# Patient Record
Sex: Female | Born: 1945 | Race: Black or African American | Hispanic: No | Smoking: Never smoker
Health system: Southern US, Community
[De-identification: ages and names within clinical notes are randomized; demographics above are authoritative.]

## PROBLEM LIST (undated history)

## (undated) DIAGNOSIS — I10 Essential (primary) hypertension: Secondary | ICD-10-CM

## (undated) DIAGNOSIS — D219 Benign neoplasm of connective and other soft tissue, unspecified: Secondary | ICD-10-CM

## (undated) DIAGNOSIS — M519 Unspecified thoracic, thoracolumbar and lumbosacral intervertebral disc disorder: Secondary | ICD-10-CM

## (undated) DIAGNOSIS — E7800 Pure hypercholesterolemia, unspecified: Secondary | ICD-10-CM

## (undated) DIAGNOSIS — I471 Supraventricular tachycardia: Secondary | ICD-10-CM

## (undated) DIAGNOSIS — E0590 Thyrotoxicosis, unspecified without thyrotoxic crisis or storm: Secondary | ICD-10-CM

## (undated) DIAGNOSIS — F419 Anxiety disorder, unspecified: Secondary | ICD-10-CM

## (undated) DIAGNOSIS — M8580 Other specified disorders of bone density and structure, unspecified site: Secondary | ICD-10-CM

## (undated) DIAGNOSIS — K922 Gastrointestinal hemorrhage, unspecified: Secondary | ICD-10-CM

## (undated) DIAGNOSIS — E559 Vitamin D deficiency, unspecified: Secondary | ICD-10-CM

## (undated) HISTORY — DX: Thyrotoxicosis, unspecified without thyrotoxic crisis or storm: E05.90

## (undated) HISTORY — DX: Anxiety disorder, unspecified: F41.9

## (undated) HISTORY — DX: Gastrointestinal hemorrhage, unspecified: K92.2

## (undated) HISTORY — DX: Essential (primary) hypertension: I10

## (undated) HISTORY — DX: Pure hypercholesterolemia, unspecified: E78.00

## (undated) HISTORY — DX: Supraventricular tachycardia: I47.1

## (undated) HISTORY — DX: Unspecified thoracic, thoracolumbar and lumbosacral intervertebral disc disorder: M51.9

## (undated) HISTORY — DX: Benign neoplasm of connective and other soft tissue, unspecified: D21.9

## (undated) HISTORY — DX: Other specified disorders of bone density and structure, unspecified site: M85.80

## (undated) HISTORY — DX: Vitamin D deficiency, unspecified: E55.9

## (undated) HISTORY — PX: MYOMECTOMY: SHX85

---

## 1998-02-10 ENCOUNTER — Ambulatory Visit (HOSPITAL_COMMUNITY): Admission: RE | Admit: 1998-02-10 | Discharge: 1998-02-10 | Payer: Self-pay | Admitting: Specialist

## 1998-12-18 ENCOUNTER — Emergency Department (HOSPITAL_COMMUNITY): Admission: EM | Admit: 1998-12-18 | Discharge: 1998-12-18 | Payer: Self-pay | Admitting: Emergency Medicine

## 1999-02-25 ENCOUNTER — Emergency Department (HOSPITAL_COMMUNITY): Admission: EM | Admit: 1999-02-25 | Discharge: 1999-02-25 | Payer: Self-pay

## 1999-05-21 ENCOUNTER — Emergency Department (HOSPITAL_COMMUNITY): Admission: EM | Admit: 1999-05-21 | Discharge: 1999-05-21 | Payer: Self-pay | Admitting: Emergency Medicine

## 1999-10-16 ENCOUNTER — Encounter: Payer: Self-pay | Admitting: Anesthesiology

## 1999-10-16 ENCOUNTER — Ambulatory Visit (HOSPITAL_COMMUNITY): Admission: RE | Admit: 1999-10-16 | Discharge: 1999-10-16 | Payer: Self-pay | Admitting: Anesthesiology

## 2000-05-05 ENCOUNTER — Emergency Department (HOSPITAL_COMMUNITY): Admission: EM | Admit: 2000-05-05 | Discharge: 2000-05-05 | Payer: Self-pay | Admitting: Emergency Medicine

## 2000-09-08 ENCOUNTER — Emergency Department (HOSPITAL_COMMUNITY): Admission: EM | Admit: 2000-09-08 | Discharge: 2000-09-08 | Payer: Self-pay | Admitting: Internal Medicine

## 2001-01-04 ENCOUNTER — Ambulatory Visit (HOSPITAL_COMMUNITY): Admission: RE | Admit: 2001-01-04 | Discharge: 2001-01-04 | Payer: Self-pay | Admitting: Neurosurgery

## 2001-01-04 ENCOUNTER — Encounter: Payer: Self-pay | Admitting: Neurosurgery

## 2003-05-25 ENCOUNTER — Emergency Department (HOSPITAL_COMMUNITY): Admission: EM | Admit: 2003-05-25 | Discharge: 2003-05-25 | Payer: Self-pay | Admitting: Emergency Medicine

## 2003-06-04 ENCOUNTER — Emergency Department (HOSPITAL_COMMUNITY): Admission: EM | Admit: 2003-06-04 | Discharge: 2003-06-04 | Payer: Self-pay | Admitting: Emergency Medicine

## 2003-06-06 ENCOUNTER — Ambulatory Visit (HOSPITAL_COMMUNITY): Admission: RE | Admit: 2003-06-06 | Discharge: 2003-06-06 | Payer: Self-pay | Admitting: Family Medicine

## 2003-07-04 ENCOUNTER — Ambulatory Visit (HOSPITAL_COMMUNITY): Admission: RE | Admit: 2003-07-04 | Discharge: 2003-07-04 | Payer: Self-pay | Admitting: Family Medicine

## 2003-07-05 ENCOUNTER — Ambulatory Visit: Admission: RE | Admit: 2003-07-05 | Discharge: 2003-07-05 | Payer: Self-pay | Admitting: Family Medicine

## 2004-02-22 ENCOUNTER — Ambulatory Visit (HOSPITAL_COMMUNITY): Admission: RE | Admit: 2004-02-22 | Discharge: 2004-02-22 | Payer: Self-pay | Admitting: Family Medicine

## 2005-09-25 ENCOUNTER — Encounter: Payer: Self-pay | Admitting: Specialist

## 2007-04-27 ENCOUNTER — Ambulatory Visit (HOSPITAL_COMMUNITY): Admission: RE | Admit: 2007-04-27 | Discharge: 2007-04-27 | Payer: Self-pay | Admitting: Family Medicine

## 2007-05-07 ENCOUNTER — Other Ambulatory Visit: Admission: RE | Admit: 2007-05-07 | Discharge: 2007-05-07 | Payer: Self-pay | Admitting: Interventional Radiology

## 2007-05-07 ENCOUNTER — Encounter (INDEPENDENT_AMBULATORY_CARE_PROVIDER_SITE_OTHER): Payer: Self-pay | Admitting: Interventional Radiology

## 2007-05-07 ENCOUNTER — Encounter: Admission: RE | Admit: 2007-05-07 | Discharge: 2007-05-07 | Payer: Self-pay | Admitting: Family Medicine

## 2008-06-01 ENCOUNTER — Inpatient Hospital Stay (HOSPITAL_COMMUNITY): Admission: EM | Admit: 2008-06-01 | Discharge: 2008-06-04 | Payer: Self-pay | Admitting: Emergency Medicine

## 2008-06-02 ENCOUNTER — Encounter (INDEPENDENT_AMBULATORY_CARE_PROVIDER_SITE_OTHER): Payer: Self-pay | Admitting: Gastroenterology

## 2009-09-24 ENCOUNTER — Emergency Department (HOSPITAL_COMMUNITY): Admission: EM | Admit: 2009-09-24 | Discharge: 2009-09-24 | Payer: Self-pay | Admitting: Emergency Medicine

## 2010-04-02 ENCOUNTER — Other Ambulatory Visit: Admission: RE | Admit: 2010-04-02 | Discharge: 2010-04-02 | Payer: Self-pay | Admitting: Family Medicine

## 2010-04-23 ENCOUNTER — Encounter: Admission: RE | Admit: 2010-04-23 | Discharge: 2010-04-23 | Payer: Self-pay | Admitting: Family Medicine

## 2010-06-17 ENCOUNTER — Encounter: Payer: Self-pay | Admitting: Neurosurgery

## 2010-06-17 ENCOUNTER — Encounter: Payer: Self-pay | Admitting: Family Medicine

## 2010-08-14 LAB — POCT CARDIAC MARKERS
CKMB, poc: 1.2 ng/mL (ref 1.0–8.0)
Myoglobin, poc: 69.4 ng/mL (ref 12–200)
Troponin i, poc: <0.05 ng/mL (ref 0.00–0.09)

## 2010-08-14 LAB — DIFFERENTIAL
Lymphocytes Relative: 13 % (ref 12–46)
Monocytes Absolute: 0.4 10*3/uL (ref 0.1–1.0)
Monocytes Relative: 4 % (ref 3–12)
Neutro Abs: 8.4 10*3/uL — ABNORMAL HIGH (ref 1.7–7.7)

## 2010-08-14 LAB — CBC
HCT: 39.8 % (ref 36.0–46.0)
MCHC: 33 g/dL (ref 30.0–36.0)
MCV: 88.2 fL (ref 78.0–100.0)
Platelets: 180 10*3/uL (ref 150–400)
RDW: 13.8 % (ref 11.5–15.5)

## 2010-08-14 LAB — COMPREHENSIVE METABOLIC PANEL
Albumin: 4.5 g/dL (ref 3.5–5.2)
BUN: 9 mg/dL (ref 6–23)
Calcium: 9.5 mg/dL (ref 8.4–10.5)
Creatinine, Ser: 0.62 mg/dL (ref 0.4–1.2)
Total Protein: 9 g/dL — ABNORMAL HIGH (ref 6.0–8.3)

## 2010-09-10 LAB — BASIC METABOLIC PANEL
BUN: 3 mg/dL — ABNORMAL LOW (ref 6–23)
CO2: 24 mEq/L (ref 19–32)
CO2: 26 mEq/L (ref 19–32)
Calcium: 8 mg/dL — ABNORMAL LOW (ref 8.4–10.5)
Calcium: 8.1 mg/dL — ABNORMAL LOW (ref 8.4–10.5)
Chloride: 104 mEq/L (ref 96–112)
Chloride: 108 mEq/L (ref 96–112)
Creatinine, Ser: 0.57 mg/dL (ref 0.4–1.2)
GFR calc Af Amer: >60 mL/min (ref >60)
GFR calc Af Amer: >60 mL/min (ref >60)
Glucose, Bld: 92 mg/dL (ref 70–99)
Potassium: 3.9 mEq/L (ref 3.5–5.1)
Sodium: 137 mEq/L (ref 135–145)

## 2010-09-10 LAB — COMPREHENSIVE METABOLIC PANEL
ALT: 19 U/L (ref 0–35)
AST: 20 U/L (ref 0–37)
AST: 22 U/L (ref 0–37)
Albumin: 3.4 g/dL — ABNORMAL LOW (ref 3.5–5.2)
Alkaline Phosphatase: 58 U/L (ref 39–117)
BUN: 7 mg/dL (ref 6–23)
CO2: 26 mEq/L (ref 19–32)
Calcium: 8.4 mg/dL (ref 8.4–10.5)
Chloride: 104 mEq/L (ref 96–112)
Creatinine, Ser: 0.71 mg/dL (ref 0.4–1.2)
GFR calc Af Amer: >60 mL/min (ref >60)
GFR calc Af Amer: >60 mL/min (ref >60)
Glucose, Bld: 128 mg/dL — ABNORMAL HIGH (ref 70–99)
Potassium: 3.2 mEq/L — ABNORMAL LOW (ref 3.5–5.1)
Sodium: 137 mEq/L (ref 135–145)
Total Bilirubin: 0.8 mg/dL (ref 0.3–1.2)
Total Protein: 6.5 g/dL (ref 6.0–8.3)
Total Protein: 7.2 g/dL (ref 6.0–8.3)

## 2010-09-10 LAB — CBC
HCT: 31.1 % — ABNORMAL LOW (ref 36.0–46.0)
HCT: 36 % (ref 36.0–46.0)
Hemoglobin: 10.4 g/dL — ABNORMAL LOW (ref 12.0–15.0)
Hemoglobin: 10.6 g/dL — ABNORMAL LOW (ref 12.0–15.0)
Hemoglobin: 12.7 g/dL (ref 12.0–15.0)
MCHC: 32.8 g/dL (ref 30.0–36.0)
MCHC: 33.2 g/dL (ref 30.0–36.0)
MCHC: 33.5 g/dL (ref 30.0–36.0)
MCV: 88.1 fL (ref 78.0–100.0)
MCV: 88.3 fL (ref 78.0–100.0)
Platelets: 180 10*3/uL (ref 150–400)
RBC: 3.53 MIL/uL — ABNORMAL LOW (ref 3.87–5.11)
RBC: 3.6 MIL/uL — ABNORMAL LOW (ref 3.87–5.11)
RBC: 4.43 MIL/uL (ref 3.87–5.11)
RDW: 13.3 % (ref 11.5–15.5)
RDW: 13.5 % (ref 11.5–15.5)
RDW: 13.6 % (ref 11.5–15.5)
WBC: 12.6 10*3/uL — ABNORMAL HIGH (ref 4.0–10.5)
WBC: 12.9 10*3/uL — ABNORMAL HIGH (ref 4.0–10.5)

## 2010-09-10 LAB — STOOL CULTURE

## 2010-09-10 LAB — CLOSTRIDIUM DIFFICILE EIA

## 2010-09-10 LAB — DIFFERENTIAL
Basophils Relative: 0 % (ref 0–1)
Eosinophils Absolute: 0 10*3/uL (ref 0.0–0.7)
Eosinophils Relative: 0 % (ref 0–5)
Lymphs Abs: 1.2 10*3/uL (ref 0.7–4.0)
Monocytes Relative: 5 % (ref 3–12)
Neutrophils Relative %: 85 % — ABNORMAL HIGH (ref 43–77)

## 2010-09-10 LAB — APTT: aPTT: 31 seconds (ref 24–37)

## 2010-09-10 LAB — TSH: TSH: 0.861 u[IU]/mL (ref 0.350–4.500)

## 2010-09-10 LAB — PROTIME-INR: INR: 1.1 (ref 0.00–1.49)

## 2010-09-10 LAB — FECAL LACTOFERRIN, QUANT: Fecal Lactoferrin: POSITIVE

## 2010-09-10 LAB — HEMOCCULT GUIAC POC 1CARD (OFFICE): Fecal Occult Bld: POSITIVE

## 2010-10-09 NOTE — H&P (Signed)
NAME:  Heidi Lucero, Heidi Lucero                 ACCOUNT NO.:  [id number]   MEDICAL RECORD NO.:  [id number]          PATIENT TYPE:  INP   LOCATION:  1317                         FACILITY[facility]  [facility]   PHYSICIAN:  Michiel Cowboy, MDDATE OF BIRTH:  Oct 26, 1945   DATE OF ADMISSION:  05/31/2008  DATE OF DISCHARGE:                              HISTORY & PHYSICAL   PRIMARY CARE [name]:  [name], M.D.   CHIEF COMPLAINT:  Bloody diarrhea.   The patient is a 65 year old female with a past medical history of  hypertension, hyperlipidemia.  Prior to this very healthy, had a normal  day, but then all of a sudden yesterday developed nausea, vomiting and  diarrhea with some bright red blood in the toilet bowl and on the  tissue.  She has no pain with defecation but has a lot of abdominal pain  and cramping.  She had history of hemorrhoids in the past but has not  had any recently.  Never had blood per rectum before.  Otherwise, no  fevers, no chills, no chest pain or shortness of breath.  She works as a  Interior and spatial designer.  She did not have any recent antibiotic exposure.  She did  eat a sandwich from [employer] but otherwise has not eaten out  recently.   REVIEW OF SYSTEMS:  Otherwise negative.   PAST MEDICAL HISTORY:  1. Significant for hypertension.  2. Hypothyroidism.   SOCIAL HISTORY:  The patient never smoked, does not use drugs.  Does not  drink.  Works as a Interior and spatial designer.   FAMILY HISTORY:  Noncontributory.   ALLERGIES:  No known drug allergies.   MEDICATIONS:  1. Amlodipine 10 mg daily.  2. Lisinopril/hydrochlorothiazide 20/25 mg daily.  3. Synthroid 50 mcg daily.   VITALS:  Temperature 98.9, blood pressure 153/54, pulse 130, now down to  97, respirations 15, saturation 99% on room air.  The patient appears to  be currently in no acute distress.  HEAD:  Nontraumatic.  Moist mucous membranes.  LUNGS:  Clear to auscultation bilaterally.  HEART:  Regular rate and rhythm.  No murmurs,  rubs or gallops.  ABDOMEN:  Soft, slightly obese, has generalized tenderness but not  severe.  No guarding.  No peritoneal signs noted.  LOWER EXTREMITIES:  Without clubbing, cyanosis or edema.   ED Hemoccult positive stools.   LABS:  White blood cell count 11.9, hemoglobin 12.7.  sodium 147,  potassium 3.2, creatinine 0.65.   CT scan of the abdomen and pelvis showed severe colitis of transverse  and descending colon, likely infectious in etiology.   ASSESSMENT AND PLAN:  1. Colitis:  Worrisome for being infectious so will send for stool      studies including  stool culture.  White blood cell count.  C. diff      although this is somewhat less likely.  Will cover with Flagyl and      Cipro, given severity of illness.  Will make n.p.o.  May need GI      consult if persists.  2. Hypertension:  Continue amlodipine plus lisinopril, would hold  hydrochlorothiazide.  3. Prophylaxis:  Protonix plus SCDs.  4. Bright red blood per rectum:  I think this is related to colitis,      probably invasive bacteria.  No symptoms that would be consistent      with upper source, also could be secondary to hemorrhoids.  If does      not improve, will need GI consult likely.  Would record stool color      and number.  Follow CBC.  At this point stable.  Give IV fluids.      Michiel Cowboy, MD  Electronically Signed     AVD/MEDQ  D:  06/01/2008  T:  06/01/2008  Job:  [id number]   cc:   Stacie Acres. Cliffton Asters, M.D.  Fax: [phone]

## 2010-10-09 NOTE — Discharge Summary (Signed)
NAME:  Heidi Lucero, Heidi Lucero                 ACCOUNT NO.:  [id number]   MEDICAL RECORD NO.:  [id number]          PATIENT TYPE:  INP   LOCATION:  1317                         FACILITY:  [facility]   PHYSICIAN:  Kela Millin, M.D.DATE OF BIRTH:  10-20-1945   DATE OF ADMISSION:  05/31/2008  DATE OF DISCHARGE:  06/05/2008                               DISCHARGE SUMMARY   DISCHARGE DIAGNOSES:  1. Ischemic colitis.  2. Hypokalemia.  3. Hypertension.  4. Hypothyroidism.   PROCEDURES AND STUDIES:  1. CT scan of abdomen and pelvis - severe colitis involving the      transverse and descending colon.  No evidence of abscess.  No free      intraperitoneal fluid.  Small hiatal hernia.  A calcified      degenerated uterine fibroid arising from the mid uterine body is      noted.  2. EGD on June 02, 2008, per Dr. Randa Evens - ischemic colitis of the      descending transverse colon.  Biopsies done and the pathology came      back consistent with ischemic colitis as well.   BRIEF HISTORY:  The patient is a pleasant 65 year old black female with  the above listed medical problems who presented with complaints of  bloody diarrhea.  She reported that she had been doing well but  developed sudden onset nausea, vomiting and diarrhea with bright red  blood in the toilet bowl.  She denied any recent antibiotic exposure.  She reported that she had eaten a [employer] sandwich but otherwise had  not eaten out recently.  She denied fevers, chills, chest pain,  shortness of breath.  She was admitted for further evaluation and  management.   Please see the full admission history and physical dictated on May 31, 2008, by Dr. Adela Glimpse for the details of admission, physical exam as  well as the laboratory data.   HOSPITAL COURSE:  1. Ischemic colitis - upon admission the patient was empirically      started on antibiotics and gastroenterology was consulted for      further recommendations.  [facility] saw the  patient and the      impression was that it was more likely ischemic colitis.  A      colonoscopy was done on June 02, 2008, and it showed ischemic      colitis.  The patient was placed on a clear liquid diet which has      been advanced as tolerated by GI to a low residual diet.  She is      tolerating solids well at this time and GI recommended discharge to      follow up outpatient with Dr. Ewing Schlein in 3-4 weeks.  Dr. Randa Evens      indicated that the patient will not require any further      antibiotics.  2. Hypokalemia - her potassium was replaced while in the hospital, her      last potassium prior to discharge 4.2.  3. Hypertension - she was maintained on her outpatient medications and  is to continue this upon discharge.   DISCHARGE MEDICATIONS:  1. She is to continue Synthroid 50 mcg p.o. daily.  2. Norvasc 10 mg p.o. daily.  3. Lisinopril/HCTZ 20/25 mg p.o. daily.   DISCHARGE DIET:  Low residual diet.   FOLLOWUP CARE:  1. Dr. Laurann Montana in 1-2 weeks - call for appointment.  2. Dr. Ewing Schlein in 3-4 weeks - call for appointment.   DISCHARGE CONDITION:  Improved/stable.      Kela Millin, M.D.  Electronically Signed     ACV/MEDQ  D:  06/04/2008  T:  06/04/2008  Job:  [id number]   cc:   Petra Kuba, M.D.  Fax: [phone]   Stacie Acres. Cliffton Asters, M.D.  Fax: [phone]

## 2010-10-09 NOTE — Op Note (Signed)
NAME:  Heidi Lucero, Heidi Lucero                 ACCOUNT NO.:  [id number]   MEDICAL RECORD NO.:  [id number]          PATIENT TYPE:  INP   LOCATION:  1317                         FACILITY:  [facility]   PHYSICIAN:  James L. Malon Kindle., M.D.DATE OF BIRTH:  1946/05/01   DATE OF PROCEDURE:  06/02/2008  DATE OF DISCHARGE:                               OPERATIVE REPORT   PROCEDURE:  Flexible sigmoidoscopy and biopsy.   MEDICATIONS:  Fentanyl 50 mcg and Versed 5 mg IV.   INDICATIONS:  This 65 year old woman came with diffuse abdominal pain.  CT showed diffuse segmental colitis in the transverse and descending  colon.   DESCRIPTION OF PROCEDURE:  The procedure had been explained and patient  consent obtained.  With the patient in the left lateral decubitus  position, the Pentax pediatric colonoscope was inserted and advanced.  We advanced to 80 cm.  There was diffuse colitis above that and up to 80  cm.  There was segmental diffuse edema and ulceration, and multiple  biopsies were taken.  As the scope was within at approximately 45-50 cm  in the sigmoid colon, it was at that transition point where it changed  it back to normal mucosa.  The sigmoid and rectum were essentially  normal.  The scope was withdrawn and the patient tolerated the procedure  well.   ASSESSMENT:  Ischemic colitis of the descending transverse colon.   PLAN:  Will keep the patient on full liquids and MiraLax and will  discharge her home as soon as she is clearly clinically better.           ______________________________  Llana Aliment Malon Kindle., M.D.     Waldron Session  D:  06/02/2008  T:  06/02/2008  Job:  [id number]   cc:   Stacie Acres. Cliffton Asters, M.D.  Fax: [phone]

## 2010-10-09 NOTE — Consult Note (Signed)
NAME:  Heidi Lucero, Heidi Lucero                 ACCOUNT NO.:  [id number]   MEDICAL RECORD NO.:  [id number]          PATIENT TYPE:  INP   LOCATION:  1317                         [facility]ACILITY:  [facility]   PHYSICIAN:  Petra Kuba, M.D.    DATE OF BIRTH:  [birth year]   DATE OF CONSULTATION:  06/01/2008  DATE OF DISCHARGE:                                 CONSULTATION   REFERRING PHYSICIAN:  Civil engineer, contracting.   HISTORY OF PRESENT ILLNESS:  This is a very pleasant 65year-old female  who began having severe abdominal pain and cramps yesterday.  She  reports that she had three episodes of diarrhea and then began having  bright red blood mixed with her diarrhea.  She has multiple bloody  stools overnight and only one so far today.  The patient denies fever or  other recent illness.  She tells me that she usually stools at least  once a day and frequently takes an herbal laxative to stool two to three  times a day.  She occasionally takes BC powders.  She has no history of  GI bleeding.  She has never had a colonoscopy.  She does know Dr. Ewing Schlein  as she has taken several family members to see him.   PAST MEDICAL HISTORY:  Is significant for:  1. Hypertension.  2. Hypothyroidism.  3. Hemorrhoids.   PRIMARY CARE PHYSICIAN:  Her PCP is Dr. Laurann Montana.   CURRENT MEDICATIONS:  Include amlodipine, lisinopril, HCTZ, Synthroid.   ALLERGIES:  She has no known drug allergies.   REVIEW OF SYSTEMS:  Negative for fever, weight loss or weight gain.   SOCIAL HISTORY:  Is negative for tobacco, alcohol and drugs.  She works  as a Interior and spatial designer.   FAMILY HISTORY:  Is negative for colon cancer.  Negative for known GI  bleeding other than hemorrhoids.   PHYSICAL EXAM:  She is alert and oriented in no apparent distress.  Temperature is 98.7, pulse 80, respirations 18, blood pressures 125/60.  HEART:  Has a regular rate and rhythm.  LUNGS:  Clear to auscultation anteriorly.  ABDOMEN:  Soft, nontender, not  distended with good bowel sounds.   LABORATORIES:  Show a potassium of 3.1, hemoglobin of 11.8, hematocrit  36.0, white count 12.9, platelets 180,000.  She is guaiac-positive.  Stool for C diff lactoferrin, stool culture are all pending.   RADIOLOGICAL EXAMS:  Include a CT of her abdomen, pelvis that was done  today.  She had wall thickening in her transverse as well as her  descending and sigmoid colon.  Her rectum was spared.  She has severe  pericolonic inflammation.   ASSESSMENT:  Dr. Vida Rigger has seen and examined the patient, collected  a history and reviewed her chart.  His impression is this is a 62-year-  old female with gastrointestinal bleeding that is likely ischemic  colitis.  We will plan for a flexible sigmoidoscopy with biopsy at 9:00  a.m. in the morning with Dr. Carman Ching.  Other than that we will  provide her with supportive care including fluids and blood transfusions  if they are needed.  Thanks very much.      Stephani Police, PA    ______________________________  Petra Kuba, M.D.    MLY/MEDQ  D:  06/01/2008  T:  06/01/2008  Job:  [id number]   cc:   Stacie Acres. Cliffton Asters, M.D.  Fax: [phone]   Petra Kuba, M.D.  Fax: [phone]

## 2011-03-12 ENCOUNTER — Other Ambulatory Visit: Payer: Self-pay | Admitting: Family Medicine

## 2011-03-12 DIAGNOSIS — E041 Nontoxic single thyroid nodule: Secondary | ICD-10-CM

## 2011-04-01 ENCOUNTER — Ambulatory Visit
Admission: RE | Admit: 2011-04-01 | Discharge: 2011-04-01 | Disposition: A | Discharge status: Home / Self Care | Payer: 59 | Source: Ambulatory Visit | Attending: Family Medicine | Admitting: Family Medicine

## 2011-04-01 DIAGNOSIS — E041 Nontoxic single thyroid nodule: Secondary | ICD-10-CM

## 2012-02-13 ENCOUNTER — Other Ambulatory Visit: Payer: Self-pay | Admitting: Family Medicine

## 2012-02-13 DIAGNOSIS — E041 Nontoxic single thyroid nodule: Secondary | ICD-10-CM

## 2012-03-16 ENCOUNTER — Ambulatory Visit
Admission: RE | Admit: 2012-03-16 | Discharge: 2012-03-16 | Disposition: A | Discharge status: Home / Self Care | Payer: Medicare Other | Source: Ambulatory Visit | Attending: Family Medicine | Admitting: Family Medicine

## 2012-03-16 DIAGNOSIS — E041 Nontoxic single thyroid nodule: Secondary | ICD-10-CM

## 2012-03-18 ENCOUNTER — Other Ambulatory Visit: Payer: 59

## 2012-08-17 ENCOUNTER — Other Ambulatory Visit: Payer: Self-pay

## 2012-08-17 ENCOUNTER — Emergency Department (HOSPITAL_COMMUNITY)
Admission: EM | Admit: 2012-08-17 | Discharge: 2012-08-17 | Disposition: A | Discharge status: Home / Self Care | Payer: No Typology Code available for payment source | Attending: Emergency Medicine | Admitting: Emergency Medicine

## 2012-08-17 ENCOUNTER — Encounter (HOSPITAL_COMMUNITY): Payer: Self-pay | Admitting: Emergency Medicine

## 2012-08-17 DIAGNOSIS — E079 Disorder of thyroid, unspecified: Secondary | ICD-10-CM | POA: Insufficient documentation

## 2012-08-17 DIAGNOSIS — I10 Essential (primary) hypertension: Secondary | ICD-10-CM | POA: Insufficient documentation

## 2012-08-17 DIAGNOSIS — Y9389 Activity, other specified: Secondary | ICD-10-CM | POA: Insufficient documentation

## 2012-08-17 DIAGNOSIS — R55 Syncope and collapse: Secondary | ICD-10-CM | POA: Insufficient documentation

## 2012-08-17 DIAGNOSIS — Z79899 Other long term (current) drug therapy: Secondary | ICD-10-CM | POA: Insufficient documentation

## 2012-08-17 DIAGNOSIS — Z043 Encounter for examination and observation following other accident: Secondary | ICD-10-CM | POA: Insufficient documentation

## 2012-08-17 DIAGNOSIS — Y92410 Unspecified street and highway as the place of occurrence of the external cause: Secondary | ICD-10-CM | POA: Insufficient documentation

## 2012-08-17 HISTORY — DX: Essential (primary) hypertension: I10

## 2012-08-17 LAB — POCT I-STAT, CHEM 8
Calcium, Ion: 1.18 mmol/L (ref 1.13–1.30)
Creatinine, Ser: 0.7 mg/dL (ref 0.50–1.10)
Glucose, Bld: 93 mg/dL (ref 70–99)
HCT: 37 % (ref 36.0–46.0)
Hemoglobin: 12.6 g/dL (ref 12.0–15.0)
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
## 2013-03-05 ENCOUNTER — Encounter: Payer: Self-pay | Admitting: Cardiology

## 2013-03-28 ENCOUNTER — Encounter: Payer: Self-pay | Admitting: Cardiology

## 2013-03-29 ENCOUNTER — Ambulatory Visit (INDEPENDENT_AMBULATORY_CARE_PROVIDER_SITE_OTHER): Payer: Medicare Other | Admitting: Cardiology

## 2013-03-29 ENCOUNTER — Encounter: Payer: Self-pay | Admitting: Cardiology

## 2013-03-29 VITALS — BP 124/76 | HR 83 | Ht 64.0 in | Wt 175.0 lb

## 2013-03-29 DIAGNOSIS — I10 Essential (primary) hypertension: Secondary | ICD-10-CM

## 2013-03-29 DIAGNOSIS — I471 Supraventricular tachycardia, unspecified: Secondary | ICD-10-CM | POA: Insufficient documentation

## 2013-03-29 DIAGNOSIS — R55 Syncope and collapse: Secondary | ICD-10-CM

## 2013-03-29 HISTORY — DX: Supraventricular tachycardia, unspecified: I47.10

## 2013-03-29 HISTORY — DX: Essential (primary) hypertension: I10

## 2013-03-29 HISTORY — DX: Supraventricular tachycardia: I47.1
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
## 2013-03-29 NOTE — Patient Instructions (Signed)
Your physician wants you to follow-up in: 1 YEAR WITH DR. SKAINS  You will receive a reminder letter in the mail two months in advance. If you don't receive a letter, please call our office to schedule the follow-up appointment.  Your physician recommends that you continue on your current medications as directed. Please refer to the Current Medication list given to you today.  

## 2013-04-05 ENCOUNTER — Ambulatory Visit: Payer: Medicare Other | Admitting: Cardiology

## 2013-04-17 ENCOUNTER — Emergency Department (HOSPITAL_COMMUNITY)
Admission: EM | Admit: 2013-04-17 | Discharge: 2013-04-17 | Disposition: A | Discharge status: Home / Self Care | Payer: Medicare Other | Attending: Emergency Medicine | Admitting: Emergency Medicine

## 2013-04-17 ENCOUNTER — Encounter (HOSPITAL_COMMUNITY): Payer: Self-pay | Admitting: Emergency Medicine

## 2013-04-17 DIAGNOSIS — E0590 Thyrotoxicosis, unspecified without thyrotoxic crisis or storm: Secondary | ICD-10-CM | POA: Insufficient documentation

## 2013-04-17 DIAGNOSIS — F411 Generalized anxiety disorder: Secondary | ICD-10-CM | POA: Insufficient documentation

## 2013-04-17 DIAGNOSIS — F19939 Other psychoactive substance use, unspecified with withdrawal, unspecified: Secondary | ICD-10-CM | POA: Insufficient documentation

## 2013-04-17 DIAGNOSIS — Z8679 Personal history of other diseases of the circulatory system: Secondary | ICD-10-CM | POA: Insufficient documentation

## 2013-04-17 DIAGNOSIS — Z8742 Personal history of other diseases of the female genital tract: Secondary | ICD-10-CM | POA: Insufficient documentation

## 2013-04-17 DIAGNOSIS — M899 Disorder of bone, unspecified: Secondary | ICD-10-CM | POA: Insufficient documentation

## 2013-04-17 DIAGNOSIS — M519 Unspecified thoracic, thoracolumbar and lumbosacral intervertebral disc disorder: Secondary | ICD-10-CM | POA: Insufficient documentation

## 2013-04-17 DIAGNOSIS — E7800 Pure hypercholesterolemia, unspecified: Secondary | ICD-10-CM | POA: Insufficient documentation

## 2013-04-17 DIAGNOSIS — Z79899 Other long term (current) drug therapy: Secondary | ICD-10-CM | POA: Insufficient documentation

## 2013-04-17 DIAGNOSIS — Z8719 Personal history of other diseases of the digestive system: Secondary | ICD-10-CM | POA: Insufficient documentation

## 2013-04-17 DIAGNOSIS — I10 Essential (primary) hypertension: Secondary | ICD-10-CM | POA: Insufficient documentation

## 2013-04-17 DIAGNOSIS — F13239 Sedative, hypnotic or anxiolytic dependence with withdrawal, unspecified: Secondary | ICD-10-CM

## 2013-04-17 DIAGNOSIS — E559 Vitamin D deficiency, unspecified: Secondary | ICD-10-CM | POA: Insufficient documentation

## 2013-04-17 LAB — POCT I-STAT, CHEM 8
Calcium, Ion: 1.17 mmol/L (ref 1.13–1.30)
Chloride: 101 mEq/L (ref 96–112)
Glucose, Bld: 105 mg/dL — ABNORMAL HIGH (ref 70–99)
HCT: 40 % (ref 36.0–46.0)
TCO2: 28 mmol/L (ref 0–100)

## 2013-04-17 MED ORDER — DIAZEPAM 5 MG/ML IJ SOLN
5.0000 mg | Freq: Once | INTRAMUSCULAR | Status: AC
  Administered 2013-04-17: 5 mg via INTRAMUSCULAR
  Filled 2013-04-17: qty 2

## 2013-04-17 MED ORDER — DIAZEPAM 2 MG PO TABS: 2.0000 mg | ORAL_TABLET | Freq: Every evening | ORAL | Status: AC | PRN

## 2013-04-17 NOTE — ED Notes (Signed)
The pt has been unable to control her rt arm  Intermittently since last pm.  No pain any where no facial droop no gait problem.  At present she keeps turning and twisting her entire rt arm and appears to be able to control it.  No previous history rt handed

## 2013-04-17 NOTE — ED Provider Notes (Signed)
CSN: [id number]     Arrival date & time 04/17/13  1801 History   First MD Initiated Contact with Patient 04/17/13 1919     Chief Complaint  Patient presents with  . rt arm spastic    (Consider location/radiation/quality/duration/timing/severity/associated sxs/prior Treatment) HPI Comments: Patient is a 67 yo F PMHx significant for HTN, hyperthyroidism, Anxiety presenting to the ED for right arm and leg spasms. The patient states she has been having intermittent uncontrolled movements of her right arm and leg w/o pain, numbness or weakness at rest over the last day and a half. Patient states the only change in her daily routine is that she ran out of her Valium that she is taking for anxiety. She denies any falls or traumas. Denies any recent illness. Patient denies SI or HI.    Past Medical History  Diagnosis Date  . Hypertension   . Hyperthyroidism   . Fibroids   . Anxiety   . Hypercholesteremia   . Lumbar disc disorder     , Bulging  . Vitamin D deficiency   . Osteopenia   . GI (gastrointestinal bleed)   . PSVT (paroxysmal supraventricular tachycardia) 03/29/2013    Likely PAT.  Marland Kitchen Essential hypertension 03/29/2013   Past Surgical History  Procedure Laterality Date  . Myomectomy     Family History  Problem Relation Age of Onset  . Hypertension Mother   . Heart attack Father   . Hypertension Father    History  Substance Use Topics  . Smoking status: Never Smoker   . Smokeless tobacco: Not on file  . Alcohol Use: No   OB History   Grav Para Term Preterm Abortions TAB SAB Ect Mult Living                 Review of Systems  Constitutional: Negative for fever and chills.  HENT: Negative.   Eyes: Negative.   Respiratory: Negative for shortness of breath.   Cardiovascular: Negative for chest pain.  Gastrointestinal: Negative for nausea and vomiting.  Genitourinary: Negative.   Musculoskeletal: Negative for joint swelling.  Skin: Negative.   Neurological: Negative for  headaches.  Psychiatric/Behavioral: Negative for suicidal ideas and self-injury. The patient is nervous/anxious.     Allergies  Crestor; Lipitor; and Pravastatin  Home Medications   Current Outpatient Rx  Name  Route  Sig  Dispense  Refill  . amLODipine (NORVASC) 10 MG tablet   Oral   Take 10 mg by mouth daily.         . Coenzyme Q10 (CO Q 10) 100 MG CAPS   Oral   Take 1 tablet by mouth daily.         Marland Kitchen ezetimibe (ZETIA) 10 MG tablet   Oral   Take 10 mg by mouth daily.         Marland Kitchen levothyroxine (SYNTHROID, LEVOTHROID) 50 MCG tablet   Oral   Take 50 mcg by mouth daily.         Marland Kitchen lisinopril-hydrochlorothiazide (PRINZIDE,ZESTORETIC) 20-25 MG per tablet   Oral   Take 1 tablet by mouth daily.         . metoprolol succinate (TOPROL-XL) 25 MG 24 hr tablet   Oral   Take 25 mg by mouth daily.         . Multiple Vitamin (MULTIVITAMIN WITH MINERALS) TABS   Oral   Take 1 tablet by mouth daily.         . Vitamin D, Ergocalciferol, (DRISDOL) 50000 UNITS  CAPS capsule   Oral   Take 50,000 Units by mouth every 7 (seven) days.         . diazepam (VALIUM) 2 MG tablet   Oral   Take 1 tablet (2 mg total) by mouth at bedtime as needed for anxiety.   7 tablet   0    BP 148/91  Pulse 72  Temp(Src) 97.7 F (36.5 C) (Oral)  Resp 16  SpO2 95% Physical Exam  Constitutional: She is oriented to person, place, and time. She appears well-developed and well-nourished. No distress.  HENT:  Head: Normocephalic and atraumatic.  Right Ear: External ear normal.  Left Ear: External ear normal.  Nose: Nose normal.  Mouth/Throat: Oropharynx is clear and moist. No oropharyngeal exudate.  Eyes: Conjunctivae and EOM are normal. Pupils are equal, round, and reactive to light.  Neck: Normal range of motion. Neck supple.  Cardiovascular: Normal rate, regular rhythm, normal heart sounds and intact distal pulses.   Pulmonary/Chest: Effort normal and breath sounds normal. No  respiratory distress.  Abdominal: Soft. There is no tenderness.  Musculoskeletal: Normal range of motion. She exhibits no edema.  Controlled voluntary movements.  At rest non-rhythmic right UE and LE movement.   Neurological: She is alert and oriented to person, place, and time. She has normal strength. No cranial nerve deficit or sensory deficit. Gait normal. GCS eye subscore is 4. GCS verbal subscore is 5. GCS motor subscore is 6.  No pronator drift. Bilateral heel-knee-shin intact.  Skin: Skin is warm and dry. She is not diaphoretic.  Psychiatric: Her mood appears anxious. She expresses no homicidal and no suicidal ideation.    ED Course  Procedures (including critical care time) Medications  diazepam (VALIUM) injection 5 mg (5 mg Intramuscular Given 04/17/13 2046)    Labs Review Labs Reviewed  POCT I-STAT, CHEM 8 - Abnormal; Notable for the following:    Glucose, Bld 105 (*)    All other components within normal limits   Imaging Review No results found.  EKG Interpretation   None       MDM   1. Benzodiazepine withdrawal    Afebrile, NAD, non-toxic appearing, AAOx4. Pt anxious appearing, w/o SI or HI. No neurofocal deficits. Normal sensation. Pt with non-rhythmic involuntary R UE and LE movement. Patient with controlled movements. No pain, numbness, or weakness noted. Intact distal pulses. Presentation non-concerning for intracranial process, likely related to start of benzodiazepine withdrawal as patient has been out of her Ativan for the last few days. Dosed patient in ED and will prescribe limited refill. Patient states her symptoms improved after receiving Ativan in ED. Advised to f/u with PCP regarding refill. Return precautions discussed. Patient is agreeable to plan. Patient is stable at time of discharge. Patient d/w with Dr. Gari Crown, agrees with plan.         Jeannetta Ellis, PA-C 04/18/13 0013

## 2013-04-17 NOTE — ED Provider Notes (Signed)
Medical screening examination/treatment/procedure(s) were conducted as a shared visit with non-physician practitioner(s) and myself.  I personally evaluated the patient during the encounter.  EKG Interpretation   None      Patient ran out of Valium within the last several days and over the last 2 days has intermittent nonrhythmic movements of her entire right arm and leg intermittently with no difficulty with changing to voluntary control and movements of her right arm and leg even during these episodes; she is no weakness or numbness to her right arm or leg no pain to her right arm or leg, she does appear anxious but is not a threat to harm herself or others she is no suicidal or homicidal ideation or hallucinations she is no chest pain or shortness of breath. Feel unlikely to be focal motor seizure activity.  Hurman Horn, MD 04/18/13 [id number]

## 2013-05-03 ENCOUNTER — Other Ambulatory Visit: Payer: Self-pay

## 2013-05-03 MED ORDER — METOPROLOL SUCCINATE ER 25 MG PO TB24: 25.0000 mg | ORAL_TABLET | Freq: Every day | ORAL | Status: DC

## 2013-05-05 ENCOUNTER — Other Ambulatory Visit: Payer: Self-pay

## 2013-05-05 MED ORDER — METOPROLOL SUCCINATE ER 25 MG PO TB24: 25.0000 mg | ORAL_TABLET | Freq: Every day | ORAL | Status: AC

## 2013-06-29 ENCOUNTER — Other Ambulatory Visit: Payer: Self-pay | Admitting: Family Medicine

## 2013-06-29 DIAGNOSIS — M542 Cervicalgia: Secondary | ICD-10-CM

## 2013-06-29 DIAGNOSIS — M5136 Other intervertebral disc degeneration, lumbar region: Secondary | ICD-10-CM

## 2013-06-29 DIAGNOSIS — M5412 Radiculopathy, cervical region: Secondary | ICD-10-CM

## 2013-07-05 ENCOUNTER — Ambulatory Visit
Admission: RE | Admit: 2013-07-05 | Discharge: 2013-07-05 | Disposition: A | Discharge status: Home / Self Care | Payer: Medicare Other | Source: Ambulatory Visit | Attending: Family Medicine | Admitting: Family Medicine

## 2013-07-05 DIAGNOSIS — M5136 Other intervertebral disc degeneration, lumbar region: Secondary | ICD-10-CM

## 2013-07-05 DIAGNOSIS — M542 Cervicalgia: Secondary | ICD-10-CM

## 2013-07-05 DIAGNOSIS — M51369 Other intervertebral disc degeneration, lumbar region without mention of lumbar back pain or lower extremity pain: Secondary | ICD-10-CM

## 2013-07-05 DIAGNOSIS — M5412 Radiculopathy, cervical region: Secondary | ICD-10-CM

## 2013-11-15 ENCOUNTER — Other Ambulatory Visit: Payer: Self-pay | Admitting: Family Medicine

## 2013-11-15 ENCOUNTER — Other Ambulatory Visit (HOSPITAL_COMMUNITY)
Admission: RE | Admit: 2013-11-15 | Discharge: 2013-11-15 | Disposition: A | Discharge status: Home / Self Care | Payer: Medicare Other | Source: Ambulatory Visit | Attending: Family Medicine | Admitting: Family Medicine

## 2013-11-15 DIAGNOSIS — Z124 Encounter for screening for malignant neoplasm of cervix: Secondary | ICD-10-CM | POA: Insufficient documentation

## 2013-11-17 LAB — CYTOLOGY - PAP

## 2013-11-19 ENCOUNTER — Other Ambulatory Visit: Payer: Self-pay | Admitting: Family Medicine

## 2013-11-19 DIAGNOSIS — R110 Nausea: Secondary | ICD-10-CM

## 2013-11-29 ENCOUNTER — Encounter (INDEPENDENT_AMBULATORY_CARE_PROVIDER_SITE_OTHER): Payer: Self-pay

## 2013-11-29 ENCOUNTER — Ambulatory Visit
Admission: RE | Admit: 2013-11-29 | Discharge: 2013-11-29 | Disposition: A | Discharge status: Home / Self Care | Payer: Medicare Other | Source: Ambulatory Visit | Attending: Family Medicine | Admitting: Family Medicine

## 2013-11-29 DIAGNOSIS — R110 Nausea: Secondary | ICD-10-CM

## 2013-11-29 MED ORDER — IOHEXOL 300 MG/ML  SOLN
100.0000 mL | Freq: Once | INTRAMUSCULAR | Status: AC | PRN
  Administered 2013-11-29: 100 mL via INTRAVENOUS

## 2014-03-14 ENCOUNTER — Other Ambulatory Visit: Payer: Self-pay | Admitting: Family Medicine

## 2014-03-14 DIAGNOSIS — E049 Nontoxic goiter, unspecified: Secondary | ICD-10-CM

## 2014-03-28 ENCOUNTER — Ambulatory Visit
Admission: RE | Admit: 2014-03-28 | Discharge: 2014-03-28 | Disposition: A | Discharge status: Home / Self Care | Payer: Medicare Other | Source: Ambulatory Visit | Attending: Family Medicine | Admitting: Family Medicine

## 2014-03-28 DIAGNOSIS — E049 Nontoxic goiter, unspecified: Secondary | ICD-10-CM

## 2014-07-29 ENCOUNTER — Emergency Department (INDEPENDENT_AMBULATORY_CARE_PROVIDER_SITE_OTHER): Payer: Medicare Other

## 2014-07-29 ENCOUNTER — Emergency Department (INDEPENDENT_AMBULATORY_CARE_PROVIDER_SITE_OTHER)
Admission: EM | Admit: 2014-07-29 | Discharge: 2014-07-29 | Disposition: A | Discharge status: Home / Self Care | Payer: Medicare Other | Source: Home / Self Care | Attending: Emergency Medicine | Admitting: Emergency Medicine

## 2014-07-29 ENCOUNTER — Encounter (HOSPITAL_COMMUNITY): Payer: Self-pay | Admitting: Emergency Medicine

## 2014-07-29 DIAGNOSIS — J111 Influenza due to unidentified influenza virus with other respiratory manifestations: Secondary | ICD-10-CM

## 2014-07-29 DIAGNOSIS — R05 Cough: Secondary | ICD-10-CM

## 2014-07-29 DIAGNOSIS — R059 Cough, unspecified: Secondary | ICD-10-CM

## 2014-07-29 DIAGNOSIS — R911 Solitary pulmonary nodule: Secondary | ICD-10-CM

## 2014-07-29 DIAGNOSIS — R69 Illness, unspecified: Principal | ICD-10-CM

## 2014-07-29 MED ORDER — BENZONATATE 200 MG PO CAPS: 200.0000 mg | ORAL_CAPSULE | Freq: Three times a day (TID) | ORAL | Status: DC | PRN

## 2014-07-29 MED ORDER — IPRATROPIUM BROMIDE 0.06 % NA SOLN: 2.0000 | Freq: Four times a day (QID) | NASAL | Status: AC

## 2014-07-29 MED ORDER — AZITHROMYCIN 250 MG PO TABS: ORAL_TABLET | ORAL | Status: AC

## 2014-07-29 MED ORDER — CEFDINIR 300 MG PO CAPS: 300.0000 mg | ORAL_CAPSULE | Freq: Two times a day (BID) | ORAL | Status: AC

## 2014-07-29 NOTE — Discharge Instructions (Signed)
Follow up with Dr. Dema Severin within next week for spot on right lung (pulmonary nodule).  Flu is a viral illness caused by the influenza virus. Here are some hints about how to deal with it:  Get extra sleep and extra fluids.  Get 7 to 9 hours of sleep per night and 6 to 8 glasses of water a day.  Getting extra sleep keeps the immune system from getting run down.  Most people with an upper respiratory infection are a little dehydrated.  The extra fluids also keep the secretions liquified and easier to deal with.  Also, get extra vitamin C.  4000 mg per day is the recommended dose. For the aches, headache, and fever, acetaminophen or ibuprofen are helpful.  These can be alternated every 4 hours.  People with liver disease should avoid large amounts of acetaminophen, and people with ulcer disease, gastroesophageal reflux, gastritis, congestive heart failure, chronic kidney disease, coronary artery disease and the elderly should avoid ibuprofen. People with flu should not take aspirin.  For nasal congestion try Mucinex-D, or if you're having lots of sneezing or clear nasal drainage use Zyrtec-D. People with high blood pressure can take these if their blood pressure is controlled, if not, it's best to avoid the forms with a "D" (decongestants).  You can use the plain Mucinex, Allegra, Claritin, or Zyrtec even if your blood pressure is not controlled.   A Saline nasal spray such as Ocean Spray can also help.  You can add a decongestant sprays such as Afrin, but you should not use the decongestant sprays for more than 3 or 4 days since they can be habituating.  Breathe Rite nasal strips can also offer a non-drug alternative treatment to nasal congestion, especially at night. For people with symptoms of sinusitis, sleeping with your head elevated can be helpful.  For sinus pain, moist, hot compresses to the face may provide some relief.  Many people find that inhaling steam as in a shower or from a pot of steaming  water can help. For sore throat, throat lozenges are helpful.  Hot salt water gargles (8 oz of hot water, 1/2 tsp of table salt, and a pinch of baking soda) can give relief as well as hot beverages such as hot tea.  Sucrets extra strength lozenges will help the sore throat.  For the cough, take Delsym 2 tsp every 12 hours.  It has also been found recently that Aleve can help control a cough.  The dose is 1 to 2 tablets twice daily with food.  This can be combined with Delsym. (Note, if you are taking ibuprofen, you should not take Aleve as well--take one or the other.) A cool mist vaporizer will help keep your mucous membranes from drying out.  If you have been prescribed an antiviral medication, be sure to take it until it is completely finished.   It's important when you have an upper respiratory infection not to pass the infection to others.  This involves being very careful about the following:  Frequent hand washing or use of hand sanitizer, especially after coughing, sneezing, blowing your nose or touching your face, nose or eyes. Do not shake hands or touch anyone and try to avoid touching surfaces that other people use such as doorknobs, shopping carts, telephones and computer keyboards. Sanitize your work station, Journalist, newspaper or phone with sanitizing wipes.  Use tissues and dispose of them properly in a garbage can or ziplock bag. Cough into your sleeve. Do not let  others eat or drink after you. Wearing a mask is a good idea.  It's also important to recognize the signs of serious illness and get evaluated if they occur: Any respiratory infection that lasts more than 7 to 10 days.  Yellow nasal drainage and sputum are not reliable indicators of a bacterial infection, but if they last for more than 1 week, see your doctor. Any kind of severe symptom such as difficulty breathing, persistent vomiting, or severe pain should prompt you to see a doctor as soon as possible.

## 2014-07-29 NOTE — ED Notes (Signed)
C/o prod. cough onset 4 days ago. C/o slight sore throat and chest sore from cough, chills and fever.

## 2014-07-29 NOTE — ED Provider Notes (Signed)
Chief Complaint   Cough   History of Present Illness   Heidi Lucero is a 69 year old female who has a four-day history of cough productive yellow sputum, chest tightness, chest pain. She denies any hemoptysis. She's also had nasal congestion with yellow drainage, sinus pressure, headache, watery eyes, chills, sweats, and myalgias. She denies any fever. Her throat is been sore. 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  Review of Systems   Other than as noted above, the patient denies any of the following symptoms: Systemic:  No fevers, chills, sweats, or myalgias. Eye:  No redness or discharge. ENT:  No ear pain, headache, nasal congestion, drainage, sinus pressure, or sore throat. Neck:  No neck pain, stiffness, or swollen glands. Lungs:  No cough, sputum production, hemoptysis, wheezing, chest tightness, shortness of breath or chest pain. GI:  No abdominal pain, nausea, vomiting or diarrhea.  [place]   Past medical history, family history, social history, meds, and allergies were reviewed. She takes medication for cholesterol, blood pressure, and Synthroid. She has never smoked cigarettes.  Physical exam   Vital signs:  BP 140/78 mmHg  Pulse 84  Temp(Src) 99.1 F (37.3 C) (Oral)  Resp 18  SpO2 97% General:  Alert and oriented.  In no distress.  Skin warm and dry. Eye:  No conjunctival injection or drainage. Lids were normal. ENT:  TMs and canals were normal, without erythema or inflammation.  Nasal mucosa was clear and uncongested, without drainage.  Mucous membranes were moist.  Pharynx was clear with no exudate or drainage.  There were no oral ulcerations or lesions. Neck:  Supple, no adenopathy, tenderness or mass. Lungs:  No respiratory distress.  She has rales at both bases, no wheezes or rhonchi, good air movement bilaterally.  Heart:  Regular rhythm, without gallops, murmers or rubs. Skin:  Clear, warm, and dry, without rash or  lesions.  Radiology   Dg Chest 2 View  07/29/2014   CLINICAL DATA:  Initial evaluation for cough and congestion for 5 days, nonsmoker  EXAM: CHEST  2 VIEW  COMPARISON:  None.  FINDINGS: Moderate sigmoid scoliosis of the thoracolumbar spine with uncoiling of the aorta. Mild cardiac enlargement with normal vascular pattern. No infiltrate or effusion. 1 cm rounded opacity projecting over the right lung base.  IMPRESSION: No evidence of pneumonia. 1 cm nodular opacity over the right lower lobe. Recommend CT thorax to evaluate for pulmonary nodule.   Electronically Signed   By: Skipper Cliche M.D.   On: 07/29/2014 21:24    Assessment     The primary encounter diagnosis was Influenza-like illness. Diagnoses of Cough and Pulmonary nodule were also pertinent to this visit.  Differential diagnosis of the pulmonary nodule includes infectious process such as pneumonia, scar tissue, granuloma, or tumor. She's never been a cigarette smoker, so she is at less risk of cancer, but I told her this was still a possibility and she should definitely follow up with her primary care physician and obtain a CT scan of her chest to rule out anything serious. In the meantime I'm going to treat with antibiotics, hoping that this is just a localized area of pneumonia or infection.  Plan    1.  Meds:  The following meds were prescribed:   New Prescriptions   AZITHROMYCIN (ZITHROMAX Z-PAK) 250 MG TABLET    Take as directed.   BENZONATATE (TESSALON) 200 MG CAPSULE    Take 1 capsule (200 mg total)  by mouth 3 (three) times daily as needed for cough.   CEFDINIR (OMNICEF) 300 MG CAPSULE    Take 1 capsule (300 mg total) by mouth 2 (two) times daily.   IPRATROPIUM (ATROVENT) 0.06 % NASAL SPRAY    Place 2 sprays into both nostrils 4 (four) times daily.    2.  Patient Education/Counseling:  The patient was given appropriate handouts, self care instructions, and instructed in symptomatic relief.  Instructed to get extra fluids and  extra rest.    3.  Follow up:  The patient was told to follow up here if no better in 3 to 4 days, or sooner if becoming worse in any way, and given some red flag symptoms such as increasing fever, difficulty breathing, chest pain, or persistent vomiting which would prompt immediate return.  Follow-up with in the next week with Dr. Harlan Stains, and I told her she needs a chest CT whether or not she feels any better at all.      Harden Mo, MD 07/29/14 [phone]

## 2014-08-01 ENCOUNTER — Other Ambulatory Visit: Payer: Self-pay | Admitting: Family Medicine

## 2016-08-13 ENCOUNTER — Other Ambulatory Visit: Payer: Self-pay | Admitting: Family Medicine

## 2016-08-13 DIAGNOSIS — E041 Nontoxic single thyroid nodule: Secondary | ICD-10-CM

## 2016-09-02 ENCOUNTER — Ambulatory Visit
Admission: RE | Admit: 2016-09-02 | Discharge: 2016-09-02 | Disposition: A | Discharge status: Home / Self Care | Payer: Medicare Other | Source: Ambulatory Visit | Attending: Family Medicine | Admitting: Family Medicine

## 2016-09-02 DIAGNOSIS — E041 Nontoxic single thyroid nodule: Secondary | ICD-10-CM

## 2019-12-14 ENCOUNTER — Other Ambulatory Visit: Payer: Self-pay

## 2019-12-14 ENCOUNTER — Other Ambulatory Visit: Payer: Self-pay | Admitting: Sports Medicine

## 2019-12-14 ENCOUNTER — Encounter: Payer: Self-pay | Admitting: Sports Medicine

## 2019-12-14 ENCOUNTER — Ambulatory Visit: Payer: Medicare Other | Admitting: Sports Medicine

## 2019-12-14 ENCOUNTER — Ambulatory Visit (INDEPENDENT_AMBULATORY_CARE_PROVIDER_SITE_OTHER): Payer: Medicare Other

## 2019-12-14 DIAGNOSIS — M2042 Other hammer toe(s) (acquired), left foot: Secondary | ICD-10-CM

## 2019-12-14 DIAGNOSIS — M2041 Other hammer toe(s) (acquired), right foot: Secondary | ICD-10-CM

## 2019-12-14 DIAGNOSIS — M79672 Pain in left foot: Secondary | ICD-10-CM

## 2019-12-14 DIAGNOSIS — L84 Corns and callosities: Secondary | ICD-10-CM | POA: Diagnosis not present

## 2019-12-14 DIAGNOSIS — M79675 Pain in left toe(s): Secondary | ICD-10-CM | POA: Diagnosis not present

## 2019-12-14 NOTE — Progress Notes (Signed)
Subjective: Heidi Lucero is a 74 y.o. female patient who presents to office for evaluation of pain at left second toe secondary to callus skin.  Patient reports last month that there was a dark area and some drainage from the callus area at the left second toe her PCP put her on antibiotics but does not remember the name has been soaking with Epson salt and applying Neosporin with improvement.  Patient denies nausea vomiting fever chills or any other constitutional symptoms at this time.  Review of systems noncontributory  Patient Active Problem List   Diagnosis Date Noted  . Syncope 03/29/2013  . PSVT (paroxysmal supraventricular tachycardia) ([place]) 03/29/2013  . Essential hypertension 03/29/2013    Current Outpatient Medications on File Prior to Visit  Medication Sig Dispense Refill  . amLODipine (NORVASC) 10 MG tablet Take 10 mg by mouth daily.    Marland Kitchen atorvastatin (LIPITOR) 20 MG tablet Take 20 mg by mouth daily.    Marland Kitchen azithromycin (ZITHROMAX Z-PAK) 250 MG tablet Take as directed. 6 tablet 0  . benzonatate (TESSALON) 200 MG capsule Take 1 capsule (200 mg total) by mouth 3 (three) times daily as needed for cough. 30 capsule 0  . cefdinir (OMNICEF) 300 MG capsule Take 1 capsule (300 mg total) by mouth 2 (two) times daily. 20 capsule 0  . cephALEXin (KEFLEX) 500 MG capsule Take 500 mg by mouth 3 (three) times daily.    . cholecalciferol (VITAMIN D) 1000 UNITS tablet Take 1,000 Units by mouth daily.    . Coenzyme Q10 (CO Q 10) 100 MG CAPS Take 1 tablet by mouth daily.    . diazepam (VALIUM) 2 MG tablet Take 1 tablet (2 mg total) by mouth at bedtime as needed for anxiety. 7 tablet 0  . ezetimibe (ZETIA) 10 MG tablet Take 10 mg by mouth daily.    . fluticasone (FLONASE) 50 MCG/ACT nasal spray Place 2 sprays into both nostrils daily.    Marland Kitchen gabapentin (NEURONTIN) 100 MG capsule Take 100 mg by mouth 3 (three) times daily as needed.    Marland Kitchen ipratropium (ATROVENT) 0.06 % nasal spray Place 2 sprays into both  nostrils 4 (four) times daily. 15 mL 12  . levothyroxine (SYNTHROID, LEVOTHROID) 50 MCG tablet Take 50 mcg by mouth daily.    Marland Kitchen lisinopril-hydrochlorothiazide (PRINZIDE,ZESTORETIC) 20-25 MG per tablet Take 1 tablet by mouth daily.    . metoprolol succinate (TOPROL-XL) 25 MG 24 hr tablet Take 1 tablet (25 mg total) by mouth daily. 90 tablet 1  . Multiple Vitamin (MULTIVITAMIN WITH MINERALS) TABS Take 1 tablet by mouth daily.    . Potassium Chloride ER 20 MEQ TBCR Take 1 tablet by mouth 2 (two) times daily.    . traMADol (ULTRAM) 50 MG tablet Take by mouth.    . Vitamin D, Ergocalciferol, (DRISDOL) 50000 UNITS CAPS capsule Take 50,000 Units by mouth every 7 (seven) days.     No current facility-administered medications on file prior to visit.    Allergies  Allergen Reactions  . Crestor [Rosuvastatin] Other (See Comments)    Myalgia  . Lipitor [Atorvastatin]     Mylagia  . Pravastatin     Myalgia    Objective:  General: Alert and oriented x3 in no acute distress  Dermatology: Keratotic lesion present dorsal interphalangeal joint second toes bilateral left greater than right upon debridement there is a preulcerative lesion noted at the left second interphalangeal joint consistent with preulcerative callus, all nails x 10 are well manicured.  Vascular:  Dorsalis Pedis 2/4 and Posterior Tibial pedal pulses 1/4, Capillary Fill Time 3 seconds, + pedal hair growth bilateral, no edema bilateral lower extremities, Temperature gradient within normal limits.  Neurology: Johney Maine sensation intact via light touch bilateral.  Musculoskeletal: Mild tenderness with palpation at the keratotic lesion site on left second toe greater than right, bunion and hammertoe deformity noted bilateral.  Pes planus foot type. Assessment and Plan: Problem List Items Addressed This Visit    None    Visit Diagnoses    Pain in left foot    -  Primary   Pre-ulcerative calluses       Hammer toes of both feet       Toe  pain, left          -Complete examination performed -Discussed treatment options for preulcerative callus/lesion and second toes left greater than right -Parred keratoic lesion using a chisel blade; treated the area with Betadine protective Band-Aid advised patient to continue with the same until there is no more soreness to the preulcerative lesion site on the left second toe -May continue with soaking with Epson salt at this point 3 times a week until soreness is gone -Encouraged good supportive shoes that do not rub the toe advised patient next visit if the toe is not sore will dispense a toe cap -Patient to return to office 4 weeks or sooner if condition worsens.  Landis Martins, DPM

## 2020-01-13 ENCOUNTER — Other Ambulatory Visit: Payer: Self-pay

## 2020-01-13 ENCOUNTER — Ambulatory Visit: Payer: Medicare Other | Admitting: Sports Medicine

## 2020-01-13 ENCOUNTER — Encounter: Payer: Self-pay | Admitting: Sports Medicine

## 2020-01-13 DIAGNOSIS — M79676 Pain in unspecified toe(s): Secondary | ICD-10-CM

## 2020-01-13 DIAGNOSIS — B351 Tinea unguium: Secondary | ICD-10-CM | POA: Diagnosis not present

## 2020-01-13 DIAGNOSIS — M2041 Other hammer toe(s) (acquired), right foot: Secondary | ICD-10-CM

## 2020-01-13 DIAGNOSIS — M2042 Other hammer toe(s) (acquired), left foot: Secondary | ICD-10-CM

## 2020-01-13 DIAGNOSIS — L84 Corns and callosities: Secondary | ICD-10-CM

## 2020-01-13 NOTE — Progress Notes (Signed)
Subjective: Robbin Hoar is a 74 y.o. female patient seen today in office with complaint of mildly painful thickened and elongated toenails; unable to trim.  Patient is also here for follow-up evaluation of preulcerative calluses at dorsal second toes left greater than right.  Patient reports that her toes seem to be doing well no concerns no wheezing no draining no soreness especially at the left second toe. Patient has no other pedal complaints at this time.   Patient Active Problem List   Diagnosis Date Noted  . Syncope 03/29/2013  . PSVT (paroxysmal supraventricular tachycardia) ([place]) 03/29/2013  . Essential hypertension 03/29/2013    Current Outpatient Medications on File Prior to Visit  Medication Sig Dispense Refill  . amLODipine (NORVASC) 10 MG tablet Take 10 mg by mouth daily.    Marland Kitchen atorvastatin (LIPITOR) 20 MG tablet Take 20 mg by mouth daily.    Marland Kitchen azithromycin (ZITHROMAX Z-PAK) 250 MG tablet Take as directed. 6 tablet 0  . benzonatate (TESSALON) 200 MG capsule Take 1 capsule (200 mg total) by mouth 3 (three) times daily as needed for cough. 30 capsule 0  . cefdinir (OMNICEF) 300 MG capsule Take 1 capsule (300 mg total) by mouth 2 (two) times daily. 20 capsule 0  . cephALEXin (KEFLEX) 500 MG capsule Take 500 mg by mouth 3 (three) times daily.    . cholecalciferol (VITAMIN D) 1000 UNITS tablet Take 1,000 Units by mouth daily.    . Coenzyme Q10 (CO Q 10) 100 MG CAPS Take 1 tablet by mouth daily.    . diazepam (VALIUM) 2 MG tablet Take 1 tablet (2 mg total) by mouth at bedtime as needed for anxiety. 7 tablet 0  . ezetimibe (ZETIA) 10 MG tablet Take 10 mg by mouth daily.    . fluticasone (FLONASE) 50 MCG/ACT nasal spray Place 2 sprays into both nostrils daily.    Marland Kitchen gabapentin (NEURONTIN) 100 MG capsule Take 100 mg by mouth 3 (three) times daily as needed.    Marland Kitchen ipratropium (ATROVENT) 0.06 % nasal spray Place 2 sprays into both nostrils 4 (four) times daily. 15 mL 12  . levothyroxine  (SYNTHROID, LEVOTHROID) 50 MCG tablet Take 50 mcg by mouth daily.    Marland Kitchen lisinopril-hydrochlorothiazide (PRINZIDE,ZESTORETIC) 20-25 MG per tablet Take 1 tablet by mouth daily.    . metoprolol succinate (TOPROL-XL) 25 MG 24 hr tablet Take 1 tablet (25 mg total) by mouth daily. 90 tablet 1  . Multiple Vitamin (MULTIVITAMIN WITH MINERALS) TABS Take 1 tablet by mouth daily.    . Potassium Chloride ER 20 MEQ TBCR Take 1 tablet by mouth 2 (two) times daily.    . traMADol (ULTRAM) 50 MG tablet Take by mouth.    . Vitamin D, Ergocalciferol, (DRISDOL) 50000 UNITS CAPS capsule Take 50,000 Units by mouth every 7 (seven) days.     No current facility-administered medications on file prior to visit.    Allergies  Allergen Reactions  . Crestor [Rosuvastatin] Other (See Comments)    Myalgia  . Lipitor [Atorvastatin]     Mylagia  . Pravastatin     Myalgia    Objective: Physical Exam  General: Well developed, nourished, no acute distress, awake, alert and oriented x 3  Vascular: Dorsalis pedis artery 2/4 bilateral, Posterior tibial artery 1/4 bilateral, skin temperature warm to warm proximal to distal bilateral lower extremities, no varicosities, pedal hair present bilateral.  Neurological: Gross sensation present via light touch bilateral.   Dermatological: Skin is warm, dry, and supple bilateral,  Nails 1-10 are tender, long, thick, and discolored with mild subungal debris, minimal hyperkeratotic tissue present bilateral second toes. No signs of infection bilateral.  Musculoskeletal:Hammertoe bilateral with bunion.  Pes planus foot type bilateral.  Muscular strength within normal limits without painon range of motion. No pain with calf compression bilateral.  Assessment and Plan:  Problem List Items Addressed This Visit    None    Visit Diagnoses    Pain due to onychomycosis of nail    -  Primary   Pre-ulcerative calluses       Hammer toes of both feet          -Examined patient.   -Discussed treatment options for painful mycotic nails. -Mechanically debrided and reduced mycotic nails with sterile nail nipper and dremel nail file without incident -Recommend tea tree oil or Vicks VapoRub to nails and vinegar soaking for nail fungus. -Discussed continued care for corns at second toes and advised patient to continue with shoes that do not rub or irritate the toes and may continue with Betadine as needed at bedtime -Patient to return as needed for follow up evaluation or sooner if symptoms worsen.  Landis Martins, DPM

## 2020-01-13 NOTE — Patient Instructions (Signed)
Vinegar soaks 1 cup of white distilled vinegar to 8 cups of warm water.  Soak 20 mins. May repeat soak two times per week.  If there is thickness to nails may file nails after soaks or after bath/shower with nail file and apply vicks vapor rub or tea tree oil. Apply oil daily to nails after filing for the best result.

## 2020-07-10 DIAGNOSIS — E039 Hypothyroidism, unspecified: Secondary | ICD-10-CM | POA: Diagnosis not present

## 2020-09-11 DIAGNOSIS — Z23 Encounter for immunization: Secondary | ICD-10-CM | POA: Diagnosis not present

## 2020-09-11 DIAGNOSIS — H40013 Open angle with borderline findings, low risk, bilateral: Secondary | ICD-10-CM | POA: Diagnosis not present

## 2020-09-11 DIAGNOSIS — H04123 Dry eye syndrome of bilateral lacrimal glands: Secondary | ICD-10-CM | POA: Diagnosis not present

## 2020-09-11 DIAGNOSIS — H524 Presbyopia: Secondary | ICD-10-CM | POA: Diagnosis not present

## 2020-09-11 DIAGNOSIS — Z961 Presence of intraocular lens: Secondary | ICD-10-CM | POA: Diagnosis not present

## 2020-09-11 DIAGNOSIS — H35033 Hypertensive retinopathy, bilateral: Secondary | ICD-10-CM | POA: Diagnosis not present

## 2020-11-13 DIAGNOSIS — G894 Chronic pain syndrome: Secondary | ICD-10-CM | POA: Diagnosis not present

## 2020-11-13 DIAGNOSIS — E039 Hypothyroidism, unspecified: Secondary | ICD-10-CM | POA: Diagnosis not present

## 2020-11-13 DIAGNOSIS — E785 Hyperlipidemia, unspecified: Secondary | ICD-10-CM | POA: Diagnosis not present

## 2020-11-13 DIAGNOSIS — I700 Atherosclerosis of aorta: Secondary | ICD-10-CM | POA: Diagnosis not present

## 2020-11-13 DIAGNOSIS — E559 Vitamin D deficiency, unspecified: Secondary | ICD-10-CM | POA: Diagnosis not present

## 2020-11-13 DIAGNOSIS — I2510 Atherosclerotic heart disease of native coronary artery without angina pectoris: Secondary | ICD-10-CM | POA: Diagnosis not present

## 2020-11-13 DIAGNOSIS — I10 Essential (primary) hypertension: Secondary | ICD-10-CM | POA: Diagnosis not present

## 2020-11-13 DIAGNOSIS — R7301 Impaired fasting glucose: Secondary | ICD-10-CM | POA: Diagnosis not present

## 2021-04-23 DIAGNOSIS — Z1231 Encounter for screening mammogram for malignant neoplasm of breast: Secondary | ICD-10-CM | POA: Diagnosis not present

## 2021-04-30 DIAGNOSIS — M545 Low back pain, unspecified: Secondary | ICD-10-CM | POA: Diagnosis not present

## 2021-05-14 DIAGNOSIS — M47896 Other spondylosis, lumbar region: Secondary | ICD-10-CM | POA: Diagnosis not present

## 2021-05-14 DIAGNOSIS — M6281 Muscle weakness (generalized): Secondary | ICD-10-CM | POA: Diagnosis not present

## 2021-05-14 DIAGNOSIS — M5416 Radiculopathy, lumbar region: Secondary | ICD-10-CM | POA: Diagnosis not present

## 2021-05-16 DIAGNOSIS — M6281 Muscle weakness (generalized): Secondary | ICD-10-CM | POA: Diagnosis not present

## 2021-05-16 DIAGNOSIS — M5416 Radiculopathy, lumbar region: Secondary | ICD-10-CM | POA: Diagnosis not present

## 2021-05-16 DIAGNOSIS — M47896 Other spondylosis, lumbar region: Secondary | ICD-10-CM | POA: Diagnosis not present

## 2021-05-30 DIAGNOSIS — I700 Atherosclerosis of aorta: Secondary | ICD-10-CM | POA: Diagnosis not present

## 2021-05-30 DIAGNOSIS — M8588 Other specified disorders of bone density and structure, other site: Secondary | ICD-10-CM | POA: Diagnosis not present

## 2021-05-30 DIAGNOSIS — E785 Hyperlipidemia, unspecified: Secondary | ICD-10-CM | POA: Diagnosis not present

## 2021-05-30 DIAGNOSIS — I2510 Atherosclerotic heart disease of native coronary artery without angina pectoris: Secondary | ICD-10-CM | POA: Diagnosis not present

## 2021-05-30 DIAGNOSIS — E039 Hypothyroidism, unspecified: Secondary | ICD-10-CM | POA: Diagnosis not present

## 2021-05-30 DIAGNOSIS — E049 Nontoxic goiter, unspecified: Secondary | ICD-10-CM | POA: Diagnosis not present

## 2021-05-30 DIAGNOSIS — G894 Chronic pain syndrome: Secondary | ICD-10-CM | POA: Diagnosis not present

## 2021-05-30 DIAGNOSIS — Z0000 Encounter for general adult medical examination without abnormal findings: Secondary | ICD-10-CM | POA: Diagnosis not present

## 2021-05-30 DIAGNOSIS — I10 Essential (primary) hypertension: Secondary | ICD-10-CM | POA: Diagnosis not present

## 2021-05-30 DIAGNOSIS — E559 Vitamin D deficiency, unspecified: Secondary | ICD-10-CM | POA: Diagnosis not present

## 2021-05-31 ENCOUNTER — Other Ambulatory Visit: Payer: Self-pay | Admitting: Family Medicine

## 2021-05-31 DIAGNOSIS — M5416 Radiculopathy, lumbar region: Secondary | ICD-10-CM | POA: Diagnosis not present

## 2021-05-31 DIAGNOSIS — M47896 Other spondylosis, lumbar region: Secondary | ICD-10-CM | POA: Diagnosis not present

## 2021-05-31 DIAGNOSIS — E049 Nontoxic goiter, unspecified: Secondary | ICD-10-CM

## 2021-05-31 DIAGNOSIS — M6281 Muscle weakness (generalized): Secondary | ICD-10-CM | POA: Diagnosis not present

## 2021-06-06 DIAGNOSIS — M5416 Radiculopathy, lumbar region: Secondary | ICD-10-CM | POA: Diagnosis not present

## 2021-06-06 DIAGNOSIS — M6281 Muscle weakness (generalized): Secondary | ICD-10-CM | POA: Diagnosis not present

## 2021-06-06 DIAGNOSIS — M47896 Other spondylosis, lumbar region: Secondary | ICD-10-CM | POA: Diagnosis not present

## 2021-06-11 DIAGNOSIS — M6281 Muscle weakness (generalized): Secondary | ICD-10-CM | POA: Diagnosis not present

## 2021-06-11 DIAGNOSIS — M47896 Other spondylosis, lumbar region: Secondary | ICD-10-CM | POA: Diagnosis not present

## 2021-06-11 DIAGNOSIS — M5416 Radiculopathy, lumbar region: Secondary | ICD-10-CM | POA: Diagnosis not present

## 2021-06-18 DIAGNOSIS — M85852 Other specified disorders of bone density and structure, left thigh: Secondary | ICD-10-CM | POA: Diagnosis not present

## 2021-06-18 DIAGNOSIS — M85851 Other specified disorders of bone density and structure, right thigh: Secondary | ICD-10-CM | POA: Diagnosis not present

## 2021-06-25 ENCOUNTER — Other Ambulatory Visit: Payer: Self-pay

## 2021-06-25 ENCOUNTER — Ambulatory Visit
Admission: RE | Admit: 2021-06-25 | Discharge: 2021-06-25 | Disposition: A | Discharge status: Home / Self Care | Payer: Medicare Other | Source: Ambulatory Visit | Attending: Family Medicine | Admitting: Family Medicine

## 2021-06-25 DIAGNOSIS — E041 Nontoxic single thyroid nodule: Secondary | ICD-10-CM | POA: Diagnosis not present

## 2021-06-25 DIAGNOSIS — E049 Nontoxic goiter, unspecified: Secondary | ICD-10-CM

## 2021-07-08 DIAGNOSIS — R051 Acute cough: Secondary | ICD-10-CM | POA: Diagnosis not present

## 2021-08-20 DIAGNOSIS — M545 Low back pain, unspecified: Secondary | ICD-10-CM | POA: Diagnosis not present

## 2021-08-23 DIAGNOSIS — M6281 Muscle weakness (generalized): Secondary | ICD-10-CM | POA: Diagnosis not present

## 2021-08-23 DIAGNOSIS — M47896 Other spondylosis, lumbar region: Secondary | ICD-10-CM | POA: Diagnosis not present

## 2021-09-03 DIAGNOSIS — M47896 Other spondylosis, lumbar region: Secondary | ICD-10-CM | POA: Diagnosis not present

## 2021-09-03 DIAGNOSIS — M6281 Muscle weakness (generalized): Secondary | ICD-10-CM | POA: Diagnosis not present

## 2021-09-05 DIAGNOSIS — M6281 Muscle weakness (generalized): Secondary | ICD-10-CM | POA: Diagnosis not present

## 2021-09-05 DIAGNOSIS — M47896 Other spondylosis, lumbar region: Secondary | ICD-10-CM | POA: Diagnosis not present

## 2021-09-10 DIAGNOSIS — M47896 Other spondylosis, lumbar region: Secondary | ICD-10-CM | POA: Diagnosis not present

## 2021-09-10 DIAGNOSIS — M6281 Muscle weakness (generalized): Secondary | ICD-10-CM | POA: Diagnosis not present

## 2021-09-17 DIAGNOSIS — M6281 Muscle weakness (generalized): Secondary | ICD-10-CM | POA: Diagnosis not present

## 2021-09-17 DIAGNOSIS — M47896 Other spondylosis, lumbar region: Secondary | ICD-10-CM | POA: Diagnosis not present

## 2021-09-17 DIAGNOSIS — M545 Low back pain, unspecified: Secondary | ICD-10-CM | POA: Diagnosis not present

## 2021-09-24 DIAGNOSIS — Z973 Presence of spectacles and contact lenses: Secondary | ICD-10-CM | POA: Diagnosis not present

## 2021-09-24 DIAGNOSIS — H04123 Dry eye syndrome of bilateral lacrimal glands: Secondary | ICD-10-CM | POA: Diagnosis not present

## 2021-09-24 DIAGNOSIS — M47896 Other spondylosis, lumbar region: Secondary | ICD-10-CM | POA: Diagnosis not present

## 2021-09-24 DIAGNOSIS — H35033 Hypertensive retinopathy, bilateral: Secondary | ICD-10-CM | POA: Diagnosis not present

## 2021-09-24 DIAGNOSIS — M6281 Muscle weakness (generalized): Secondary | ICD-10-CM | POA: Diagnosis not present

## 2021-09-24 DIAGNOSIS — H40013 Open angle with borderline findings, low risk, bilateral: Secondary | ICD-10-CM | POA: Diagnosis not present

## 2021-09-24 DIAGNOSIS — H524 Presbyopia: Secondary | ICD-10-CM | POA: Diagnosis not present

## 2021-10-08 DIAGNOSIS — M6281 Muscle weakness (generalized): Secondary | ICD-10-CM | POA: Diagnosis not present

## 2021-10-08 DIAGNOSIS — M47896 Other spondylosis, lumbar region: Secondary | ICD-10-CM | POA: Diagnosis not present

## 2021-10-15 DIAGNOSIS — M47896 Other spondylosis, lumbar region: Secondary | ICD-10-CM | POA: Diagnosis not present

## 2021-10-15 DIAGNOSIS — M6281 Muscle weakness (generalized): Secondary | ICD-10-CM | POA: Diagnosis not present

## 2021-10-25 ENCOUNTER — Encounter (HOSPITAL_COMMUNITY): Payer: Self-pay

## 2021-10-25 ENCOUNTER — Emergency Department (HOSPITAL_COMMUNITY): Payer: Medicare Other

## 2021-10-25 ENCOUNTER — Emergency Department (HOSPITAL_COMMUNITY)
Admission: EM | Admit: 2021-10-25 | Discharge: 2021-10-25 | Disposition: A | Discharge status: Home / Self Care | Payer: Medicare Other | Attending: Emergency Medicine | Admitting: Emergency Medicine

## 2021-10-25 DIAGNOSIS — I700 Atherosclerosis of aorta: Secondary | ICD-10-CM | POA: Diagnosis not present

## 2021-10-25 DIAGNOSIS — Z79899 Other long term (current) drug therapy: Secondary | ICD-10-CM | POA: Diagnosis not present

## 2021-10-25 DIAGNOSIS — I10 Essential (primary) hypertension: Secondary | ICD-10-CM | POA: Diagnosis not present

## 2021-10-25 DIAGNOSIS — R1032 Left lower quadrant pain: Secondary | ICD-10-CM | POA: Diagnosis not present

## 2021-10-25 DIAGNOSIS — K529 Noninfective gastroenteritis and colitis, unspecified: Secondary | ICD-10-CM | POA: Diagnosis not present

## 2021-10-25 DIAGNOSIS — K625 Hemorrhage of anus and rectum: Secondary | ICD-10-CM | POA: Diagnosis not present

## 2021-10-25 LAB — CBC WITH DIFFERENTIAL/PLATELET
Abs Immature Granulocytes: 0.03 10*3/uL (ref 0.00–0.07)
Basophils Absolute: 0 10*3/uL (ref 0.0–0.1)
Basophils Relative: 0 %
Eosinophils Absolute: 0 10*3/uL (ref 0.0–0.5)
Eosinophils Relative: 0 %
HCT: 43 % (ref 36.0–46.0)
Hemoglobin: 13.5 g/dL (ref 12.0–15.0)
Immature Granulocytes: 0 %
Lymphocytes Relative: 7 %
Lymphs Abs: 0.8 10*3/uL (ref 0.7–4.0)
MCH: 28.2 pg (ref 26.0–34.0)
MCHC: 31.4 g/dL (ref 30.0–36.0)
MCV: 89.8 fL (ref 80.0–100.0)
Monocytes Absolute: 0.6 10*3/uL (ref 0.1–1.0)
Monocytes Relative: 5 %
Neutro Abs: 10.9 10*3/uL — ABNORMAL HIGH (ref 1.7–7.7)
Neutrophils Relative %: 88 %
Platelets: 182 10*3/uL (ref 150–400)
RBC: 4.79 MIL/uL (ref 3.87–5.11)
RDW: 13.2 % (ref 11.5–15.5)
WBC: 12.4 10*3/uL — ABNORMAL HIGH (ref 4.0–10.5)
nRBC: 0 % (ref 0.0–0.2)

## 2021-10-25 LAB — URINALYSIS, ROUTINE W REFLEX MICROSCOPIC
Bilirubin Urine: NEGATIVE
Glucose, UA: NEGATIVE mg/dL
Hgb urine dipstick: NEGATIVE
Ketones, ur: NEGATIVE mg/dL
Leukocytes,Ua: NEGATIVE
Nitrite: NEGATIVE
Protein, ur: NEGATIVE mg/dL
Specific Gravity, Urine: 1.032 — ABNORMAL HIGH (ref 1.005–1.030)
pH: 7 (ref 5.0–8.0)

## 2021-10-25 LAB — COMPREHENSIVE METABOLIC PANEL
ALT: 26 U/L (ref 0–44)
AST: 26 U/L (ref 15–41)
Albumin: 4.2 g/dL (ref 3.5–5.0)
Alkaline Phosphatase: 54 U/L (ref 38–126)
Anion gap: 11 (ref 5–15)
BUN: 24 mg/dL — ABNORMAL HIGH (ref 8–23)
CO2: 21 mmol/L — ABNORMAL LOW (ref 22–32)
Calcium: 9.5 mg/dL (ref 8.9–10.3)
Chloride: 105 mmol/L (ref 98–111)
Creatinine, Ser: 0.73 mg/dL (ref 0.44–1.00)
GFR, Estimated: >60 mL/min (ref >60)
Glucose, Bld: 127 mg/dL — ABNORMAL HIGH (ref 70–99)
Potassium: 3.6 mmol/L (ref 3.5–5.1)
Sodium: 137 mmol/L (ref 135–145)
Total Bilirubin: 1.2 mg/dL (ref 0.3–1.2)
Total Protein: 8.2 g/dL — ABNORMAL HIGH (ref 6.5–8.1)

## 2021-10-25 LAB — TYPE AND SCREEN
ABO/RH(D): AB POS
Antibody Screen: POSITIVE
PT AG Type: NEGATIVE

## 2021-10-25 LAB — LACTIC ACID, PLASMA: Lactic Acid, Venous: 1.2 mmol/L (ref 0.5–1.9)

## 2021-10-25 LAB — LIPASE, BLOOD: Lipase: 29 U/L (ref 11–51)

## 2021-10-25 MED ORDER — IOHEXOL 300 MG/ML  SOLN
100.0000 mL | Freq: Once | INTRAMUSCULAR | Status: AC | PRN
  Administered 2021-10-25: 80 mL via INTRAVENOUS

## 2021-10-25 MED ORDER — ONDANSETRON HCL 4 MG/2ML IJ SOLN
4.0000 mg | Freq: Once | INTRAMUSCULAR | Status: AC
  Administered 2021-10-25: 4 mg via INTRAVENOUS
  Filled 2021-10-25: qty 2

## 2021-10-25 MED ORDER — AMOXICILLIN-POT CLAVULANATE 875-125 MG PO TABS
1.0000 | ORAL_TABLET | Freq: Two times a day (BID) | ORAL | 0 refills | Status: AC
Start: 2021-10-25 — End: 2021-11-04

## 2021-10-25 MED ORDER — ONDANSETRON HCL 4 MG PO TABS: 4.0000 mg | ORAL_TABLET | Freq: Three times a day (TID) | ORAL | 0 refills | Status: AC | PRN

## 2021-10-25 MED ORDER — SODIUM CHLORIDE 0.9 % IV BOLUS
1000.0000 mL | Freq: Once | INTRAVENOUS | Status: AC
  Administered 2021-10-25: 1000 mL via INTRAVENOUS

## 2021-10-25 MED ORDER — FENTANYL CITRATE PF 50 MCG/ML IJ SOSY
50.0000 ug | PREFILLED_SYRINGE | Freq: Once | INTRAMUSCULAR | Status: AC
  Administered 2021-10-25: 50 ug via INTRAVENOUS
  Filled 2021-10-25: qty 1

## 2021-10-25 NOTE — ED Triage Notes (Signed)
Pt arrived via POV, c/o rectal bleeding (bright red), weakness for several days.

## 2021-10-25 NOTE — ED Notes (Signed)
Pt aware urine sample needed 

## 2021-10-25 NOTE — ED Provider Triage Note (Signed)
Emergency Medicine Provider Triage Evaluation Note  Heidi Lucero , a 76 y.o. female  was evaluated in triage.  Pt complains of weakness.  Patient states that she has been feeling weak for the past few days.  Last night she began experiencing abdominal pain as well as diarrhea, nausea, and vomiting.  States that she has had multiple bouts of hematochezia since her symptoms began.  Reports a history of diverticulitis and feels that her symptoms are consistent.  Physical Exam  BP 140/86   Pulse 93   Temp 98.7 F (37.1 C) (Oral)   Resp 18   SpO2 97%  Gen:   Awake, no distress   Resp:  Normal effort  MSK:   Moves extremities without difficulty  Other:    Medical Decision Making  Medically screening exam initiated at 1:32 PM.  Appropriate orders placed.  Heidi Lucero was informed that the remainder of the evaluation will be completed by another provider, this initial triage assessment does not replace that evaluation, and the importance of remaining in the ED until their evaluation is complete.   Heidi Sexton, PA-C 10/25/21 1333

## 2021-10-25 NOTE — ED Notes (Signed)
Ginger ale provided.

## 2021-10-25 NOTE — ED Provider Notes (Addendum)
[facility] DEPT Provider Note   CSN: [id number] Arrival date & time: 10/25/21  1300     History  Chief Complaint  Patient presents with   Rectal Bleeding    Heidi Lucero is a 76 y.o. female.  The history is provided by the patient and medical records. No language interpreter was used.  Rectal Bleeding Quality:  Bright red Amount:  Moderate Duration:  3 days Timing:  Intermittent Chronicity:  Recurrent Context: not hemorrhoids and not rectal pain   Similar prior episodes: yes   Relieved by:  Nothing Worsened by:  Nothing Ineffective treatments:  None tried Associated symptoms: abdominal pain and vomiting (mild)   Associated symptoms: no fever and no recent illness       Home Medications Prior to Admission medications   Medication Sig Start Date End Date Taking? Authorizing Provider  amLODipine (NORVASC) 10 MG tablet Take 10 mg by mouth daily.    [provider]  atorvastatin (LIPITOR) 20 MG tablet Take 20 mg by mouth daily. 11/01/19   [provider]  azithromycin (ZITHROMAX Z-PAK) 250 MG tablet Take as directed. 07/29/14   Harden Mo, MD  benzonatate (TESSALON) 200 MG capsule Take 1 capsule (200 mg total) by mouth 3 (three) times daily as needed for cough. 07/29/14   Harden Mo, MD  cefdinir (OMNICEF) 300 MG capsule Take 1 capsule (300 mg total) by mouth 2 (two) times daily. 07/29/14   Harden Mo, MD  cephALEXin (KEFLEX) 500 MG capsule Take 500 mg by mouth 3 (three) times daily. 11/19/19   [provider]  cholecalciferol (VITAMIN D) 1000 UNITS tablet Take 1,000 Units by mouth daily.    [provider]  Coenzyme Q10 (CO Q 10) 100 MG CAPS Take 1 tablet by mouth daily.    [provider]  diazepam (VALIUM) 2 MG tablet Take 1 tablet (2 mg total) by mouth at bedtime as needed for anxiety. 04/17/13   Piepenbrink, Anderson Malta, PA-C  ezetimibe (ZETIA) 10 MG tablet Take 10 mg by mouth daily.     [provider]  fluticasone (FLONASE) 50 MCG/ACT nasal spray Place 2 sprays into both nostrils daily. 07/29/19   [provider]  gabapentin (NEURONTIN) 100 MG capsule Take 100 mg by mouth 3 (three) times daily as needed. 07/29/19   [provider]  ipratropium (ATROVENT) 0.06 % nasal spray Place 2 sprays into both nostrils 4 (four) times daily. 07/29/14   Harden Mo, MD  levothyroxine (SYNTHROID, LEVOTHROID) 50 MCG tablet Take 50 mcg by mouth daily.    [provider]  lisinopril-hydrochlorothiazide (PRINZIDE,ZESTORETIC) 20-25 MG per tablet Take 1 tablet by mouth daily.    [provider]  metoprolol succinate (TOPROL-XL) 25 MG 24 hr tablet Take 1 tablet (25 mg total) by mouth daily. 05/05/13   Jerline Pain, MD  Multiple Vitamin (MULTIVITAMIN WITH MINERALS) TABS Take 1 tablet by mouth daily.    [provider]  Potassium Chloride ER 20 MEQ TBCR Take 1 tablet by mouth 2 (two) times daily. 11/01/19   [provider]  traMADol (ULTRAM) 50 MG tablet Take by mouth. 12/01/19   [provider]  Vitamin D, Ergocalciferol, (DRISDOL) 50000 UNITS CAPS capsule Take 50,000 Units by mouth every 7 (seven) days.    [provider]      Allergies    Crestor [rosuvastatin], Lipitor [atorvastatin], and Pravastatin    Review of Systems   Review of Systems  Constitutional:  Positive for fatigue. Negative for chills, diaphoresis and fever.  HENT:  Negative for congestion.   Respiratory:  Negative for cough (resolved), chest tightness, shortness of breath and wheezing.   Cardiovascular:  Negative for chest pain and palpitations.  Gastrointestinal:  Positive for abdominal pain, blood in stool, diarrhea, hematochezia, nausea and vomiting (mild). Negative for constipation and rectal pain.  Genitourinary:  Negative for dysuria, flank pain and frequency.  Musculoskeletal:  Negative for back pain, neck pain and neck stiffness.  Skin:   Negative for rash and wound.  Neurological:  Negative for headaches.  Psychiatric/Behavioral:  Negative for agitation and confusion.   All other systems reviewed and are negative.  Physical Exam Updated Vital Signs BP 140/86   Pulse 93   Temp 98.7 F (37.1 C) (Oral)   Resp 18   SpO2 97%  Physical Exam Vitals and nursing note reviewed.  Constitutional:      General: She is not in acute distress.    Appearance: She is well-developed. She is not ill-appearing, toxic-appearing or diaphoretic.  HENT:     Head: Normocephalic and atraumatic.     Nose: Nose normal.     Mouth/Throat:     Mouth: Mucous membranes are dry.     Pharynx: No oropharyngeal exudate or posterior oropharyngeal erythema.  Eyes:     Extraocular Movements: Extraocular movements intact.     Conjunctiva/sclera: Conjunctivae normal.     Pupils: Pupils are equal, round, and reactive to light.  Cardiovascular:     Rate and Rhythm: Normal rate and regular rhythm.     Heart sounds: No murmur heard. Pulmonary:     Effort: Pulmonary effort is normal. No respiratory distress.     Breath sounds: Normal breath sounds. No wheezing or rhonchi.  Chest:     Chest wall: No tenderness.  Abdominal:     General: Abdomen is flat. Bowel sounds are normal.     Palpations: Abdomen is soft.     Tenderness: There is abdominal tenderness in the right lower quadrant, periumbilical area, suprapubic area and left lower quadrant. There is no right CVA tenderness, left CVA tenderness, guarding or rebound.    Genitourinary:    Comments: Rectal exam offered however given her conviction that is bleeding and lack of rectal pain or other rectal troubles, she did not want a rectal exam. Musculoskeletal:        General: No swelling, tenderness or signs of injury.     Cervical back: Neck supple.     Right lower leg: No edema.     Left lower leg: No edema.  Skin:    General: Skin is warm and dry.     Capillary Refill: Capillary refill takes  less than 2 seconds.     Coloration: Skin is not pale.     Findings: No erythema or rash.  Neurological:     General: No focal deficit present.     Mental Status: She is alert.  Psychiatric:        Mood and Affect: Mood normal.    ED Results / Procedures / Treatments   Labs (all labs ordered are listed, but only abnormal results are displayed) Labs Reviewed  COMPREHENSIVE METABOLIC PANEL - Abnormal; Notable for the following components:      Result Value   CO2 21 (*)    Glucose, Bld 127 (*)    BUN 24 (*)    Total Protein 8.2 (*)    All other components within normal limits  CBC WITH DIFFERENTIAL/PLATELET - Abnormal; Notable for the following components:   WBC 12.4 (*)    Neutro Abs 10.9 (*)    All other components within normal limits  URINALYSIS, ROUTINE W REFLEX MICROSCOPIC - Abnormal; Notable for the following components:   Color, Urine STRAW (*)    Specific Gravity, Urine 1.032 (*)    All other components within normal limits  URINE CULTURE  LIPASE, BLOOD  LACTIC ACID, PLASMA  TSH  TYPE AND SCREEN  ABO/RH    EKG EKG Interpretation  Date/Time:  Thursday October 25 2021 13:48:37 EDT Ventricular Rate:  91 PR Interval:  185 QRS Duration: 83 QT Interval:  359 QTC Calculation: 442 R Axis:   15 Text Interpretation: Sinus rhythm Ventricular premature complex Probable left atrial enlargement Borderline T abnormalities, anterior leads when compared to prior, new pvc. No STEMI Confirmed by Antony Blackbird [phone]) on 10/25/2021 3:31:13 PM  Radiology CT ABDOMEN PELVIS W CONTRAST  Result Date: 10/25/2021 CLINICAL DATA:  Left lower quadrant abdominal pain. EXAM: CT ABDOMEN AND PELVIS WITH CONTRAST TECHNIQUE: Multidetector CT imaging of the abdomen and pelvis was performed using the standard protocol following bolus administration of intravenous contrast. RADIATION DOSE REDUCTION: This exam was performed according to the departmental dose-optimization program which includes automated  exposure control, adjustment of the mA and/or kV according to patient size and/or use of iterative reconstruction technique. CONTRAST:  34m OMNIPAQUE IOHEXOL 300 MG/ML  SOLN COMPARISON:  November 29, 2013. FINDINGS: Lower chest: Visualized lung bases are unremarkable. Small sliding-type hiatal hernia is noted. Hepatobiliary: No focal liver abnormality is seen. No gallstones, gallbladder wall thickening, or biliary dilatation. Pancreas: Unremarkable. No pancreatic ductal dilatation or surrounding inflammatory changes. Spleen: Normal in size without focal abnormality. Adrenals/Urinary Tract: Adrenal glands are unremarkable. Kidneys are normal, without renal calculi, focal lesion, or hydronephrosis. Bladder is unremarkable. Stomach/Bowel: Stomach is unremarkable. There is no evidence of bowel obstruction. Wall thickening of splenic flexure and descending colon is noted concerning for infectious or inflammatory colitis. Vascular/Lymphatic: Aortic atherosclerosis. No enlarged abdominal or pelvic lymph nodes. Reproductive: Multiple uterine fibroids are noted, some of which are calcified. No definite adnexal abnormality is noted. Other: No abdominal wall hernia or abnormality. No abdominopelvic ascites. Musculoskeletal: No acute or significant osseous findings. IMPRESSION: Wall thickening of splenic flexure and descending colon is noted consistent with infectious or inflammatory colitis. Small sliding-type hiatal hernia. Fibroid uterus is noted. Aortic Atherosclerosis (ICD10-I70.0). Electronically Signed   By: JMarijo ConceptionM.D.   On: 10/25/2021 16:45    Procedures Procedures    Medications Ordered in ED Medications  sodium chloride 0.9 % bolus 1,000 mL (has no administration in time range)  fentaNYL (SUBLIMAZE) injection 50 mcg (has no administration in time range)  ondansetron (ZOFRAN) injection 4 mg (has no administration in time range)    ED Course/ Medical Decision Making/ A&P                            Medical Decision Making Amount and/or Complexity of Data Reviewed Labs: ordered. Radiology: ordered.  Risk Prescription drug management.    Heidi Lucero is a 76y.o. female with a past medical history significant for hypertension, thyroid disease, fibroids status post previous myomectomy, previous GI bleeding related to diverticulitis/diverticulosis, and osteopenia who presents with several days of lower abdominal pain, diarrhea, intermittent bloody stools, and some nausea/vomiting.  Patient reports that for the last 3 days she has had the symptoms gradually  worsening.  She reports the abdominal pain comes and goes and it is more moderate now but was up to 10 out of 10 at times.  It is an aching pain across her lower abdomen.  She reports it does not go to her back.  She denies any constipation but does report some loose stools that were nonbloody initially followed by some bloody stools.  She has had several episodes today.  She reports that she had significant rectal bleeding in the past when she had diverticulitis and reports it feels similar.  She reported an episode of nausea and vomiting but she thought that was more related to pain.  She denies any actual syncope or lightheadedness but does feel fatigued and tired.  She denies chest pain or shortness of breath.  She had cough the other day that she reports has resolved.  Denies any sick contact.  Denies any trauma.  Denies any rashes.  Denies any vaginal complaints.  Denies any rectal pain and has no history of hemorrhoids reported.  On exam, lungs clear and chest nontender.  Abdomen is tender across the lower abdomen including the right lower quadrant and left lower quadrant.  Patient did have bowel sounds.  Back and flanks nontender.  No rash seen.  Legs nontender and nonedematous.  Patient resting comfortably.  She does have a dry mouth.  She is slightly tachycardic, initial evaluation.  Exam otherwise unremarkable.  Given the patient's  history of similar presentation with diverticulitis I do feel that we need to get imaging to rule out a diverticulitis or acute complication.  We will also give her some fluids for dry mucous membranes and I suspect a degree of dehydration with the diarrhea preceding the intermittent bleeding.  We will check urinalysis given her lower abdominal discomfort.  We will give her some pain medicine, nausea medicine, and have her be n.p.o. in case there is something requiring a procedure.  Given patient's report of rectal bleeding and abdominal pain, will start work-up to rule out a life-threatening etiology.  If work-up is reassuring and hemoglobin is not too low, dissipate discharge follow-up outpatient GI team as she has in the past.  6:39 PM CT scan showed colitis but no evidence of large diverticulitis, perforation, obstruction, or abscess.  We also discussed the finding of a fibroid uterus and a hiatal hernia which she knows about.  Patient is feeling much better now and passed a p.o. challenge.  We feel she is safe for discharge home to take antibiotics and use nausea medicine to maintain hydration at home.  She will use over-the-counter medications and follow-up with her primary doctor.  She did with questions or concerns and was discharged in good condition.        Final Clinical Impression(s) / ED Diagnoses Final diagnoses:  Colitis  Rectal bleeding    Rx / DC Orders ED Discharge Orders          Ordered    ondansetron (ZOFRAN) 4 MG tablet  Every 8 hours PRN        10/25/21 1838    amoxicillin-clavulanate (AUGMENTIN) 875-125 MG tablet  Every 12 hours        10/25/21 1838            Clinical Impression: 1. Colitis   2. Rectal bleeding     Disposition: Discharge  Condition: Good  I have discussed the results, Dx and Tx plan with the pt(& family if present). He/she/they expressed understanding and agree(s) with  the plan. Discharge instructions discussed at great length.  Strict return precautions discussed and pt &/or family have verbalized understanding of the instructions. No further questions at time of discharge.    New Prescriptions   AMOXICILLIN-CLAVULANATE (AUGMENTIN) 875-125 MG TABLET    Take 1 tablet by mouth every 12 (twelve) hours for 10 days.   ONDANSETRON (ZOFRAN) 4 MG TABLET    Take 1 tablet (4 mg total) by mouth every 8 (eight) hours as needed for nausea or vomiting.    Follow Up: Harlan Stains, [place] [address] [phone]     your GI team     [place] DEPT [address] [phone] [place] [phone]        Eliga Arvie, Gwenyth Allegra, MD 10/25/21 1840    Lovey Crupi, Gwenyth Allegra, MD 10/25/21 [phone]

## 2021-10-25 NOTE — Discharge Instructions (Signed)
Your history, exam, work-up today did show evidence of colitis in your left colon likely contributing to the abdominal pain, diarrhea, and bloody stools.  Your hemoglobin was normal today so you are not critically anemic.  We did the CT image that did not show evidence of perforation, abscess, obstruction, or other emergent finding that would require procedure or admission at this time.  As you have had improvement in symptoms, we feel you are safe for discharge home to use over-the-counter medication, use the nausea medicine, and treat with antibiotics for what I suspect is the colitis/early diverticulitis given your history of the same.  Please follow-up with your primary doctor and your GI team.  If any symptoms change or worsen acutely, please return to the nearest emergency department.  Please rest and stay hydrated.

## 2021-10-26 LAB — URINE CULTURE

## 2021-11-05 DIAGNOSIS — K529 Noninfective gastroenteritis and colitis, unspecified: Secondary | ICD-10-CM | POA: Diagnosis not present

## 2021-11-23 DIAGNOSIS — K529 Noninfective gastroenteritis and colitis, unspecified: Secondary | ICD-10-CM | POA: Diagnosis not present

## 2021-11-23 DIAGNOSIS — R1030 Lower abdominal pain, unspecified: Secondary | ICD-10-CM | POA: Diagnosis not present

## 2021-11-23 DIAGNOSIS — R1031 Right lower quadrant pain: Secondary | ICD-10-CM | POA: Diagnosis not present

## 2021-12-03 DIAGNOSIS — I700 Atherosclerosis of aorta: Secondary | ICD-10-CM | POA: Diagnosis not present

## 2021-12-03 DIAGNOSIS — E785 Hyperlipidemia, unspecified: Secondary | ICD-10-CM | POA: Diagnosis not present

## 2021-12-03 DIAGNOSIS — I10 Essential (primary) hypertension: Secondary | ICD-10-CM | POA: Diagnosis not present

## 2021-12-03 DIAGNOSIS — G894 Chronic pain syndrome: Secondary | ICD-10-CM | POA: Diagnosis not present

## 2021-12-03 DIAGNOSIS — E049 Nontoxic goiter, unspecified: Secondary | ICD-10-CM | POA: Diagnosis not present

## 2021-12-03 DIAGNOSIS — I2510 Atherosclerotic heart disease of native coronary artery without angina pectoris: Secondary | ICD-10-CM | POA: Diagnosis not present

## 2021-12-03 DIAGNOSIS — D649 Anemia, unspecified: Secondary | ICD-10-CM | POA: Diagnosis not present

## 2021-12-03 DIAGNOSIS — E039 Hypothyroidism, unspecified: Secondary | ICD-10-CM | POA: Diagnosis not present

## 2021-12-06 DIAGNOSIS — K529 Noninfective gastroenteritis and colitis, unspecified: Secondary | ICD-10-CM | POA: Diagnosis not present

## 2021-12-06 DIAGNOSIS — R1030 Lower abdominal pain, unspecified: Secondary | ICD-10-CM | POA: Diagnosis not present

## 2021-12-17 DIAGNOSIS — E042 Nontoxic multinodular goiter: Secondary | ICD-10-CM | POA: Diagnosis not present

## 2021-12-31 DIAGNOSIS — K5730 Diverticulosis of large intestine without perforation or abscess without bleeding: Secondary | ICD-10-CM | POA: Diagnosis not present

## 2022-03-04 DIAGNOSIS — Z23 Encounter for immunization: Secondary | ICD-10-CM | POA: Diagnosis not present

## 2022-04-29 DIAGNOSIS — Z1231 Encounter for screening mammogram for malignant neoplasm of breast: Secondary | ICD-10-CM | POA: Diagnosis not present

## 2022-05-23 DIAGNOSIS — R928 Other abnormal and inconclusive findings on diagnostic imaging of breast: Secondary | ICD-10-CM | POA: Diagnosis not present

## 2022-05-23 DIAGNOSIS — R922 Inconclusive mammogram: Secondary | ICD-10-CM | POA: Diagnosis not present

## 2022-06-24 ENCOUNTER — Other Ambulatory Visit: Payer: Self-pay | Admitting: Family Medicine

## 2022-06-24 DIAGNOSIS — E042 Nontoxic multinodular goiter: Secondary | ICD-10-CM | POA: Diagnosis not present

## 2022-06-24 DIAGNOSIS — I10 Essential (primary) hypertension: Secondary | ICD-10-CM | POA: Diagnosis not present

## 2022-06-24 DIAGNOSIS — I700 Atherosclerosis of aorta: Secondary | ICD-10-CM | POA: Diagnosis not present

## 2022-06-24 DIAGNOSIS — M5136 Other intervertebral disc degeneration, lumbar region: Secondary | ICD-10-CM | POA: Diagnosis not present

## 2022-06-24 DIAGNOSIS — E785 Hyperlipidemia, unspecified: Secondary | ICD-10-CM | POA: Diagnosis not present

## 2022-06-24 DIAGNOSIS — I779 Disorder of arteries and arterioles, unspecified: Secondary | ICD-10-CM

## 2022-06-24 DIAGNOSIS — M8588 Other specified disorders of bone density and structure, other site: Secondary | ICD-10-CM | POA: Diagnosis not present

## 2022-06-24 DIAGNOSIS — E559 Vitamin D deficiency, unspecified: Secondary | ICD-10-CM | POA: Diagnosis not present

## 2022-06-24 DIAGNOSIS — E039 Hypothyroidism, unspecified: Secondary | ICD-10-CM | POA: Diagnosis not present

## 2022-06-24 DIAGNOSIS — I2510 Atherosclerotic heart disease of native coronary artery without angina pectoris: Secondary | ICD-10-CM | POA: Diagnosis not present

## 2022-06-24 DIAGNOSIS — Z0000 Encounter for general adult medical examination without abnormal findings: Secondary | ICD-10-CM | POA: Diagnosis not present

## 2022-07-08 DIAGNOSIS — M545 Low back pain, unspecified: Secondary | ICD-10-CM | POA: Diagnosis not present

## 2022-07-22 DIAGNOSIS — M6281 Muscle weakness (generalized): Secondary | ICD-10-CM | POA: Diagnosis not present

## 2022-07-22 DIAGNOSIS — M47896 Other spondylosis, lumbar region: Secondary | ICD-10-CM | POA: Diagnosis not present

## 2022-07-23 DIAGNOSIS — M47896 Other spondylosis, lumbar region: Secondary | ICD-10-CM | POA: Diagnosis not present

## 2022-07-23 DIAGNOSIS — M6281 Muscle weakness (generalized): Secondary | ICD-10-CM | POA: Diagnosis not present

## 2022-07-24 ENCOUNTER — Other Ambulatory Visit: Payer: Medicare Other

## 2022-07-29 ENCOUNTER — Ambulatory Visit
Admission: RE | Admit: 2022-07-29 | Discharge: 2022-07-29 | Disposition: A | Discharge status: Home / Self Care | Payer: Medicare Other | Source: Ambulatory Visit | Attending: Family Medicine | Admitting: Family Medicine

## 2022-07-29 DIAGNOSIS — I6523 Occlusion and stenosis of bilateral carotid arteries: Secondary | ICD-10-CM | POA: Diagnosis not present

## 2022-07-29 DIAGNOSIS — I779 Disorder of arteries and arterioles, unspecified: Secondary | ICD-10-CM

## 2022-07-29 DIAGNOSIS — I771 Stricture of artery: Secondary | ICD-10-CM | POA: Diagnosis not present

## 2022-07-31 DIAGNOSIS — M6281 Muscle weakness (generalized): Secondary | ICD-10-CM | POA: Diagnosis not present

## 2022-07-31 DIAGNOSIS — M47896 Other spondylosis, lumbar region: Secondary | ICD-10-CM | POA: Diagnosis not present

## 2022-08-06 DIAGNOSIS — M47896 Other spondylosis, lumbar region: Secondary | ICD-10-CM | POA: Diagnosis not present

## 2022-08-06 DIAGNOSIS — M6281 Muscle weakness (generalized): Secondary | ICD-10-CM | POA: Diagnosis not present

## 2022-08-06 IMAGING — CT CT ABD-PELV W/ CM
2 of 5 series · 16 of 46 positions shown, 18 images · IV contrast (agent unspecified)
Comparison: November 29, 2013.

CLINICAL DATA: Left lower quadrant abdominal pain.

EXAM:
CT ABDOMEN AND PELVIS WITH CONTRAST
TECHNIQUE: Multidetector CT imaging of the abdomen and pelvis was performed
using the standard protocol following bolus administration of
intravenous contrast.

[Series 2: axial st · axial · 0.73mm/px · z∈[-476,-130]mm · 13 of 81 slices shown, 15 images]
[im 6/81  soft-tissue]
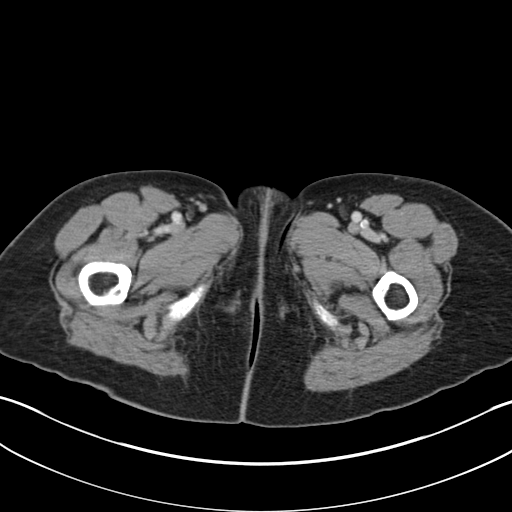
[im 6/81  bone]
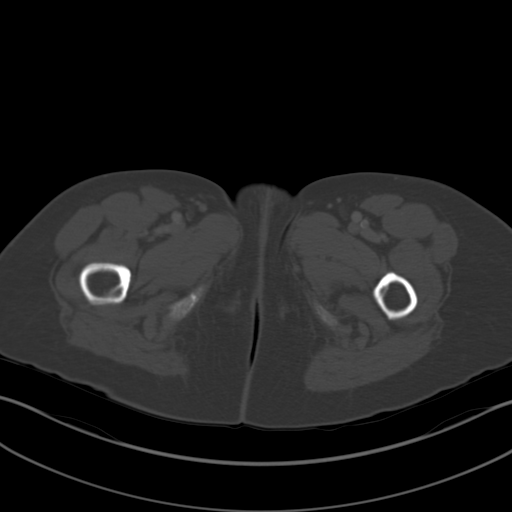
[im 11/81  soft-tissue]
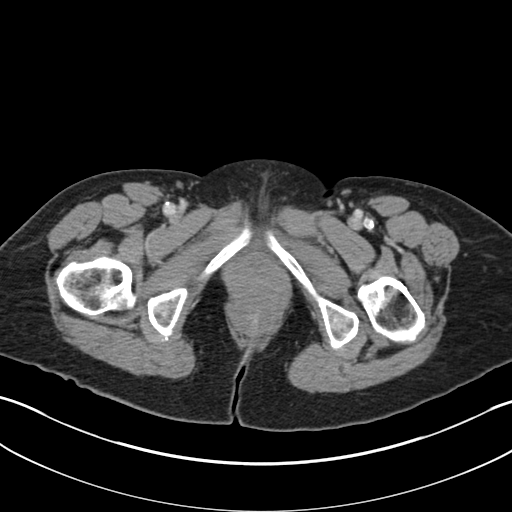
[im 17/81  soft-tissue]
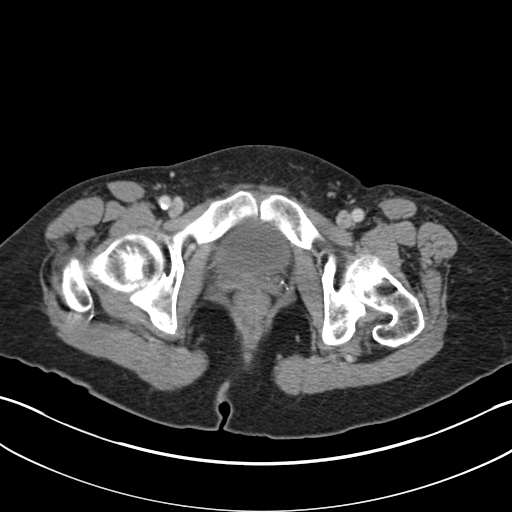
[im 22/81  soft-tissue]
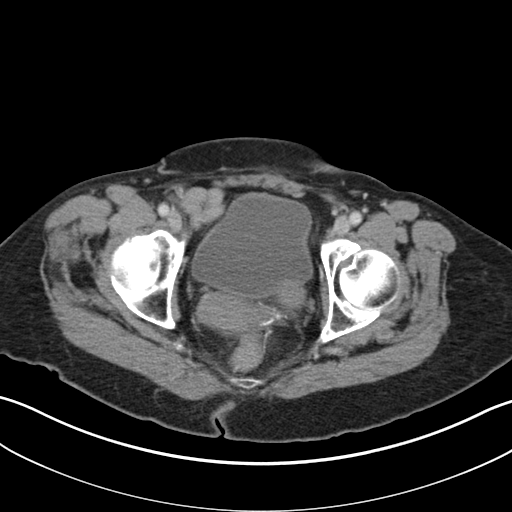
[im 27/81  soft-tissue]
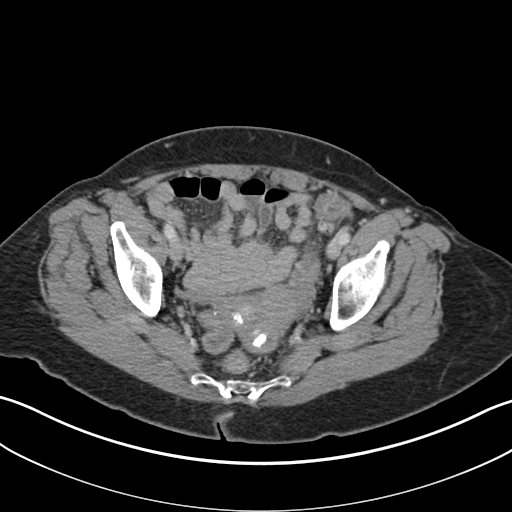
[im 33/81  soft-tissue]
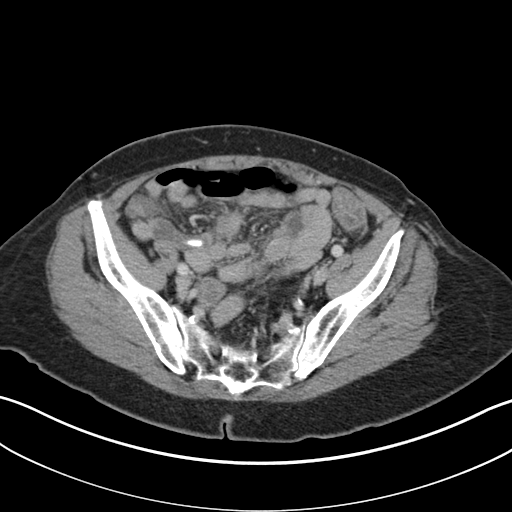
[im 43/81  soft-tissue]
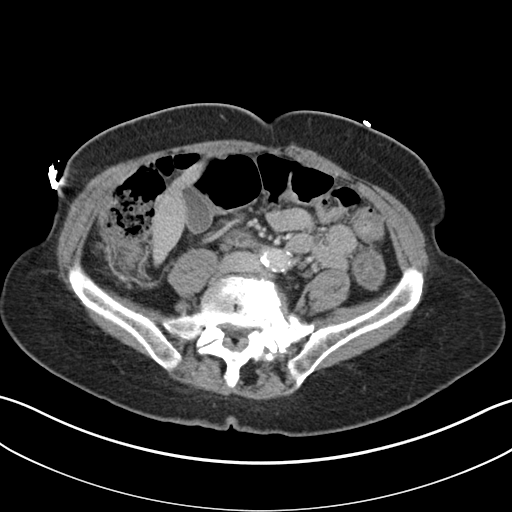
[im 49/81  soft-tissue]
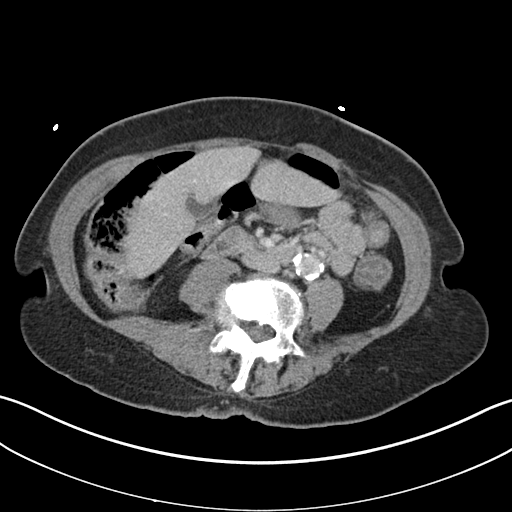
[im 54/81  soft-tissue]
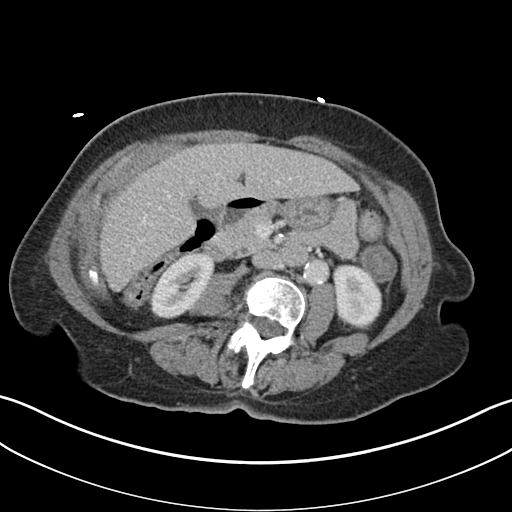
[im 54/81  bone]
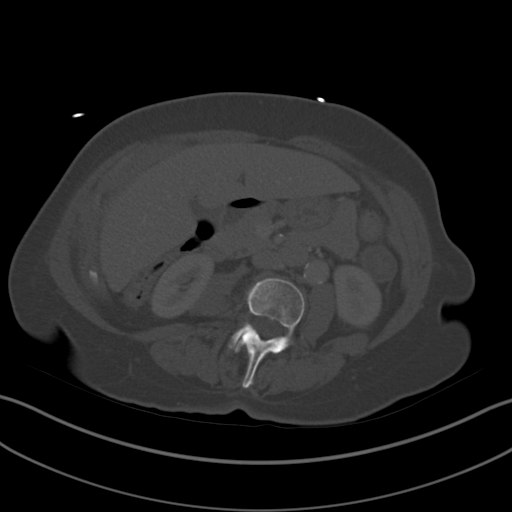
[im 59/81  soft-tissue]
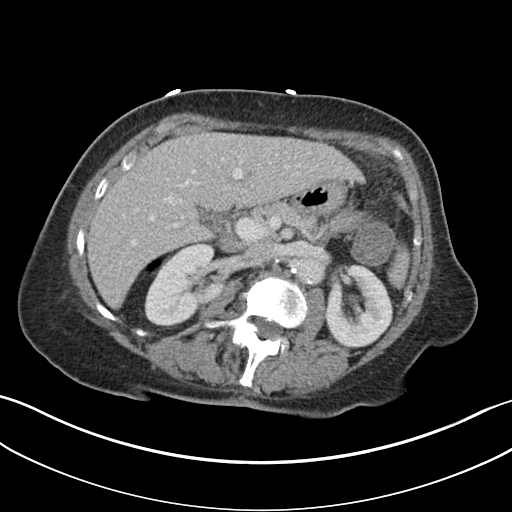
[im 65/81  soft-tissue]
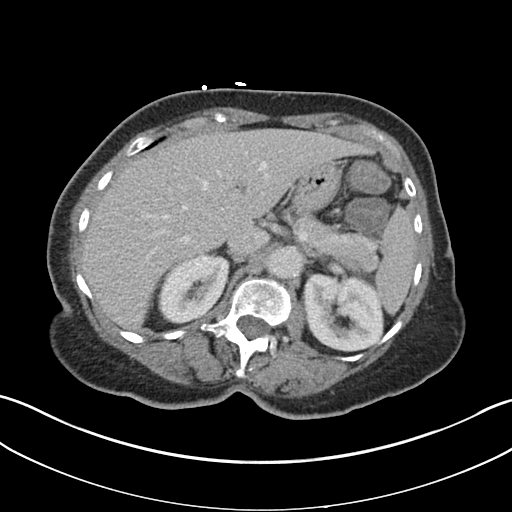
[im 70/81  soft-tissue]
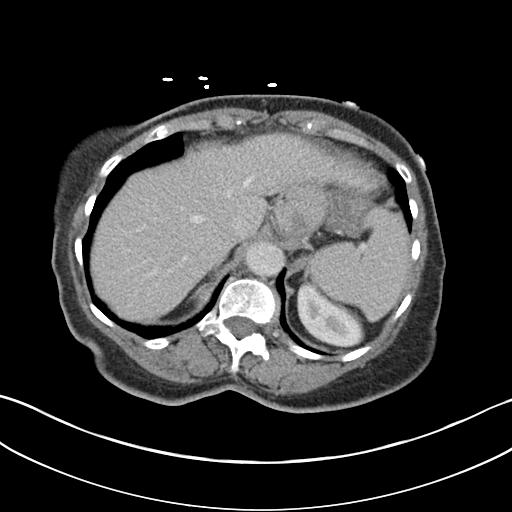
[im 75/81  soft-tissue]
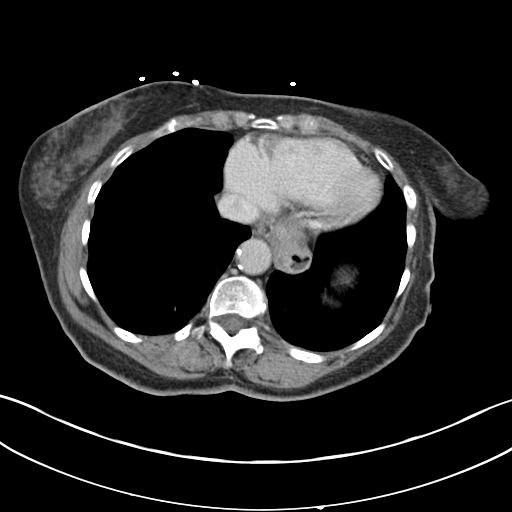

[Series 4: coronal st · coronal · 0.81mm/px · 3 of 128 slices shown]
[im 43/128  soft-tissue]
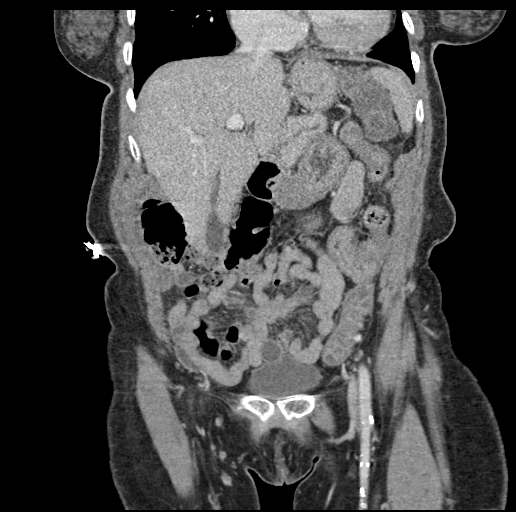
[im 57/128  soft-tissue]
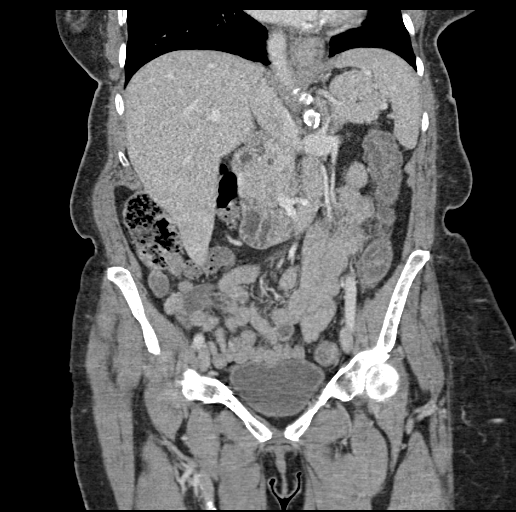
[im 71/128  soft-tissue]
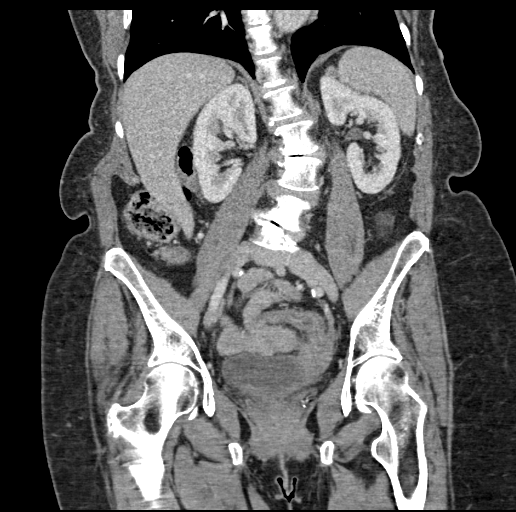

[16 of 46 positions shown; findings below may reference images not displayed]

RADIATION DOSE REDUCTION: This exam was performed according to the
departmental dose-optimization program which includes automated
exposure control, adjustment of the mA and/or kV according to
patient size and/or use of iterative reconstruction technique.

CONTRAST:  80mL OMNIPAQUE IOHEXOL 300 MG/ML  SOLN
FINDINGS: Lower chest: Visualized lung bases are unremarkable. Small
sliding-type hiatal hernia is noted.

Hepatobiliary: No focal liver abnormality is seen. No gallstones,
gallbladder wall thickening, or biliary dilatation.

Pancreas: Unremarkable. No pancreatic ductal dilatation or
surrounding inflammatory changes.

Spleen: Normal in size without focal abnormality.

Adrenals/Urinary Tract: Adrenal glands are unremarkable. Kidneys are
normal, without renal calculi, focal lesion, or hydronephrosis.
Bladder is unremarkable.

Stomach/Bowel: Stomach is unremarkable. There is no evidence of
bowel obstruction. Wall thickening of splenic flexure and descending
colon is noted concerning for infectious or inflammatory colitis.

Vascular/Lymphatic: Aortic atherosclerosis. No enlarged abdominal or
pelvic lymph nodes.

Reproductive: Multiple uterine fibroids are noted, some of which are
calcified. No definite adnexal abnormality is noted.

Other: No abdominal wall hernia or abnormality. No abdominopelvic
ascites.

Musculoskeletal: No acute or significant osseous findings.
IMPRESSION: Wall thickening of splenic flexure and descending colon is noted
consistent with infectious or inflammatory colitis.

Small sliding-type hiatal hernia.

Fibroid uterus is noted.

Aortic Atherosclerosis ([id number]).

## 2022-08-07 DIAGNOSIS — M6281 Muscle weakness (generalized): Secondary | ICD-10-CM | POA: Diagnosis not present

## 2022-08-07 DIAGNOSIS — M47896 Other spondylosis, lumbar region: Secondary | ICD-10-CM | POA: Diagnosis not present

## 2022-08-19 DIAGNOSIS — M545 Low back pain, unspecified: Secondary | ICD-10-CM | POA: Diagnosis not present

## 2022-09-09 DIAGNOSIS — H40011 Open angle with borderline findings, low risk, right eye: Secondary | ICD-10-CM | POA: Diagnosis not present

## 2022-09-09 DIAGNOSIS — H401123 Primary open-angle glaucoma, left eye, severe stage: Secondary | ICD-10-CM | POA: Diagnosis not present

## 2022-09-09 DIAGNOSIS — H35361 Drusen (degenerative) of macula, right eye: Secondary | ICD-10-CM | POA: Diagnosis not present

## 2022-09-09 DIAGNOSIS — H524 Presbyopia: Secondary | ICD-10-CM | POA: Diagnosis not present

## 2022-09-09 DIAGNOSIS — H35033 Hypertensive retinopathy, bilateral: Secondary | ICD-10-CM | POA: Diagnosis not present

## 2022-09-11 DIAGNOSIS — R110 Nausea: Secondary | ICD-10-CM | POA: Diagnosis not present

## 2022-10-07 DIAGNOSIS — H401123 Primary open-angle glaucoma, left eye, severe stage: Secondary | ICD-10-CM | POA: Diagnosis not present

## 2022-12-23 DIAGNOSIS — M5136 Other intervertebral disc degeneration, lumbar region: Secondary | ICD-10-CM | POA: Diagnosis not present

## 2022-12-23 DIAGNOSIS — E042 Nontoxic multinodular goiter: Secondary | ICD-10-CM | POA: Diagnosis not present

## 2022-12-23 DIAGNOSIS — G894 Chronic pain syndrome: Secondary | ICD-10-CM | POA: Diagnosis not present

## 2022-12-23 DIAGNOSIS — I10 Essential (primary) hypertension: Secondary | ICD-10-CM | POA: Diagnosis not present

## 2022-12-23 DIAGNOSIS — E039 Hypothyroidism, unspecified: Secondary | ICD-10-CM | POA: Diagnosis not present

## 2022-12-23 DIAGNOSIS — R110 Nausea: Secondary | ICD-10-CM | POA: Diagnosis not present

## 2022-12-23 DIAGNOSIS — E785 Hyperlipidemia, unspecified: Secondary | ICD-10-CM | POA: Diagnosis not present

## 2022-12-23 DIAGNOSIS — I2510 Atherosclerotic heart disease of native coronary artery without angina pectoris: Secondary | ICD-10-CM | POA: Diagnosis not present

## 2022-12-23 DIAGNOSIS — I700 Atherosclerosis of aorta: Secondary | ICD-10-CM | POA: Diagnosis not present

## 2023-01-13 DIAGNOSIS — H524 Presbyopia: Secondary | ICD-10-CM | POA: Diagnosis not present

## 2023-01-13 DIAGNOSIS — H40011 Open angle with borderline findings, low risk, right eye: Secondary | ICD-10-CM | POA: Diagnosis not present

## 2023-01-13 DIAGNOSIS — H401123 Primary open-angle glaucoma, left eye, severe stage: Secondary | ICD-10-CM | POA: Diagnosis not present

## 2023-01-20 NOTE — Therapy (Addendum)
OUTPATIENT PHYSICAL THERAPY THORACOLUMBAR EVALUATION   Patient Name: Heidi Lucero MRN: [id number] DOB:[birth year], 77 y.o., female Today's Date: 01/21/2023  END OF SESSION:  PT End of Session - 01/21/23 1327     Visit Number 1    Number of Visits 10    Date for PT Re-Evaluation 03/21/23    Authorization Type [employer] mcr    PT Start Time 1210    PT Stop Time 1245    PT Time Calculation (min) 35 min    Activity Tolerance Patient tolerated treatment well    Behavior During Therapy WFL for tasks assessed/performed             Past Medical History:  Diagnosis Date   Anxiety    Essential hypertension 03/29/2013   Fibroids    GI (gastrointestinal bleed)    Hypercholesteremia    Hypertension    Hyperthyroidism    Lumbar disc disorder    , Bulging   Osteopenia    PSVT (paroxysmal supraventricular tachycardia) 03/29/2013   Likely PAT.   Vitamin D deficiency    Past Surgical History:  Procedure Laterality Date   MYOMECTOMY     Patient Active Problem List   Diagnosis Date Noted   Syncope 03/29/2013   PSVT (paroxysmal supraventricular tachycardia) 03/29/2013   Essential hypertension 03/29/2013    PCP:  Laurann Montana, MD   REFERRING PROVIDER: Elsie Lincoln MD  REFERRING DIAG: - Low back pain, unspecified   Rationale for Evaluation and Treatment: Rehabilitation  THERAPY DIAG:  Other low back pain  Muscle weakness (generalized)  Other abnormalities of gait and mobility  ONSET DATE: exacerbation last 2-3 years  SUBJECTIVE:                                                                                                                                                                                           SUBJECTIVE STATEMENT: Other spondylosis, lumbar region. Have had therapy before land based earlier in year.  I am here because the pain continues. Land based didn't help the pain.  Wanted to try aquatics before having injections. Bad in am but it improves as day  progresses.  I am afraid to do the surgery.  PERTINENT HISTORY:  Dx as per Laurann Montana Md 7/24: Chronic pain syndrome G89.4 ; Lumbar degenerative disc disease M51.36   PAIN:  Are you having pain? Yes: NPRS scale: current 7/10; worst 10/10; least 7/10 Pain location: Lb into right leg Pain description: ache,stabbing some tingling into right leg to ankle Aggravating factors: sitting too long Relieving factors: standing, lying down, tramodol  PRECAUTIONS: Fall  RED FLAGS: None   WEIGHT BEARING  RESTRICTIONS: No  FALLS:  Has patient fallen in last 6 months? No  LIVING ENVIRONMENT: Lives with: lives alone Lives in: House/apartment Stairs: No Has following equipment at home: Single point cane  OCCUPATION: sitting job part time  PLOF: Independent  PATIENT GOALS: decrease pain; do household chores; walk better  NEXT MD VISIT: no  OBJECTIVE:   DIAGNOSTIC FINDINGS:  None in chart  PATIENT SURVEYS:  Primary measure 50% with goal of 57%   COGNITION: Overall cognitive status: Within functional limits for tasks assessed     SENSATION: WFL  MUSCLE LENGTH: Hamstrings: wfl in sitting   POSTURE: forward head, decreased lumbar lordosis, flexed trunk , and weight shift left  PALPATION: Mild TTP right glut  LUMBAR ROM:   AROM eval  Flexion full  Extension 25%  Right lateral flexion full  Left lateral flexion full  Right rotation   Left rotation    (Blank rows = not tested)  LOWER EXTREMITY ROM:     WFL  LOWER EXTREMITY MMT:    MMT Right eval Left eval  Hip flexion 47.0 49.8  Hip extension    Hip abduction 23.3 24.1  Hip adduction    Hip internal rotation    Hip external rotation    Knee flexion    Knee extension 27.0 17.8  Ankle dorsiflexion    Ankle plantarflexion    Ankle inversion    Ankle eversion     (Blank rows = not tested)  LUMBAR SPECIAL TESTS:  Slump test: Negative and Thomas test: Negative  FUNCTIONAL TESTS:  5 times sit to  stand: 24.57 from bench at pool Timed up and go (TUG): 14.15 Staged balance:tandem: unable; SLS: unable  GAIT: Distance walked: 400 ft Assistive device utilized: None Level of assistance: Complete Independence Comments: Knees knocking, unilateral rue arm swing, slight trunk rotation to left, L shoulder braced in retracted position; left leaning forward flexed  TODAY'S TREATMENT:                                                                                                                              Evaluation Functional testing FOTO   PATIENT EDUCATION:  Education details: Discussed eval findings, rehab rationale, aquatic program progression/POC and pools in area. Patient is in agreement  Person educated: Patient Education method: Explanation Education comprehension: verbalized understanding  HOME EXERCISE PROGRAM: Pt has land based HEP.  Will assign aquatic HEP  ASSESSMENT:  CLINICAL IMPRESSION: Patient is a 77 y.o. f who was seen today for physical therapy evaluation and treatment for Low back pain. She is a poor historian and has difficulty describing her pain (where, amount, how often or how long).  She is fidgety throughout eval. She has been seeing Dr Yisroel Ramming for LB concerns, requested aquatics after land based intervention was not helpful.  PCP indicates her dx is Lumbar DDD and chronic pain syndrome but there are no diagnostics in chart. She presents with complaints of LBP across  hips with some radicular symptoms into rle (frequency or intensity undefined).  Lumbar ROM limited only in extension.  She has strength deficit bilaterally and gait abnormalities.  Her goal is to avoid  steroid injections/surgery. She will benefit from aquatic therapy intervention using the properties of water to improve function and decrease pain.  Will transition to land based intervention as tolerated.  OBJECTIVE IMPAIRMENTS: Abnormal gait, decreased activity tolerance, decreased balance,  decreased endurance, decreased knowledge of condition, decreased ROM, decreased strength, postural dysfunction, and pain.   ACTIVITY LIMITATIONS: lifting, bending, sitting, standing, squatting, transfers, and locomotion level  PARTICIPATION LIMITATIONS: meal prep, cleaning, laundry, shopping, and community activity  PERSONAL FACTORS: Behavior pattern and Time since onset of injury/illness/exacerbation are also affecting patient's functional outcome.   REHAB POTENTIAL: Good  CLINICAL DECISION MAKING: Evolving/moderate complexity  EVALUATION COMPLEXITY: Moderate   GOALS: Goals reviewed with patient? Yes  SHORT TERM GOALS: Target date: Sept 27,2024  Pt will tolerate full aquatic sessions consistently without increase in pain and with improving function to demonstrate good toleration and effectiveness of intervention.  Baseline: Goal status: INITIAL  2.  Pt will complete tandem and SLS 3.6 ft holding x 20s to demonstrate improving balance. Baseline: unable to hold on land Goal status: INITIAL  3.  Pt will improve on Tug test to <or= 11s to demonstrate improvement in lower extremity function, mobility and decreased fall risk.  Baseline: 14.15 Goal status: INITIAL  4.  Pt will report decreased frequency of max pain to <or=4 x weekly Baseline: 10/10 every morning Goal status: INITIAL  5.  Pt will be able to walk to and from setting along with completing full aquatic session without increased pain or fatigue to demonstrate improved toleration to activity Baseline:  Goal status: INITIAL    LONG TERM GOALS: Target date: Oct 25,24  Pt to meet stated Foto Goal of 57 Baseline: 50 Goal status: INITIAL  2.  Pt will be indep with final Aquatic HEP for continued management of condition Baseline: none Goal status: INITIAL  3.  Pt will have an overall decrease in pain by at least 4 NPRS to improve function and sleeping Baseline: see chart Goal status: INITIAL  4.  Pt will  complete tandem and SLS land based holding x 10 seconds to demonstrate decreased fall risk Baseline: unable Goal status: INITIAL  5.  Pt will improve on 5 X STS test to <or= 16 s to demonstrate improving functional lower extremity strength, transitional movements, and balance Baseline: 24.57 Goal status: INITIAL  6.  Pt will report toleration to pushing vacuum cleaner. Baseline: not tolerated Goal status: INITIAL  PLAN:  PT FREQUENCY: 1-2x/week  PT DURATION: 8 weeks likely no more than 10 visits  PLANNED INTERVENTIONS: Therapeutic exercises, Therapeutic activity, Neuromuscular re-education, Balance training, Gait training, Patient/Family education, Self Care, Joint mobilization, Joint manipulation, Stair training, Orthotic/Fit training, DME instructions, Aquatic Therapy, Dry Needling, Electrical stimulation, Spinal mobilization, Cryotherapy, Moist heat, Splintting, Ionotophoresis 4mg /ml Dexamethasone, Manual therapy, and Re-evaluation.  PLAN FOR NEXT SESSION: Aquatics for core and le strengthening, toleration to amb, endurance and balance retraining   Corrie Dandy Tomma Lightning) Taren Toops MPT 01/21/2023, 1:55 PM  Date of referral: 12/25/22 Referring provider:   Marcene Corning, MD   Referring diagnosis? Low back pain, unspecified  Treatment diagnosis? (if different than referring diagnosis) Low back pain, unspecified   What was this (referring dx) caused by? Ongoing Issue  Ashby Dawes of Condition: Chronic (continuous duration > 3 months)   Laterality: Both  Current Functional Measure  Score: FOTO 50%  Objective measurements identify impairments when they are compared to normal values, the uninvolved extremity, and prior level of function.  [x]  Yes  []  No  Objective assessment of functional ability: Moderate functional limitations   Briefly describe symptoms: Chronic LBP across lumbar area with radicular pain into RLE  How did symptoms start: LB dysfunction  Average pain intensity:  Last  24 hours: 7/10  Past week: 7/10  How often does the pt experience symptoms? Constantly  How much have the symptoms interfered with usual daily activities? Quite a bit  How has condition changed since care began at this facility? NA - initial visit  In general, how is the patients overall health? Good  Addend Corrie Dandy [facility]) Jamiel Goncalves MPT 02/12/23  131p

## 2023-01-21 ENCOUNTER — Other Ambulatory Visit: Payer: Self-pay

## 2023-01-21 ENCOUNTER — Ambulatory Visit (HOSPITAL_BASED_OUTPATIENT_CLINIC_OR_DEPARTMENT_OTHER): Payer: Medicare Other | Attending: Orthopaedic Surgery | Admitting: Physical Therapy

## 2023-01-21 ENCOUNTER — Encounter (HOSPITAL_BASED_OUTPATIENT_CLINIC_OR_DEPARTMENT_OTHER): Payer: Self-pay | Admitting: Physical Therapy

## 2023-01-21 DIAGNOSIS — M6281 Muscle weakness (generalized): Secondary | ICD-10-CM | POA: Insufficient documentation

## 2023-01-21 DIAGNOSIS — M5459 Other low back pain: Secondary | ICD-10-CM | POA: Diagnosis not present

## 2023-01-21 DIAGNOSIS — R2689 Other abnormalities of gait and mobility: Secondary | ICD-10-CM | POA: Diagnosis not present

## 2023-02-05 ENCOUNTER — Ambulatory Visit (HOSPITAL_BASED_OUTPATIENT_CLINIC_OR_DEPARTMENT_OTHER): Payer: Medicare Other | Admitting: Physical Therapy

## 2023-02-05 DIAGNOSIS — E042 Nontoxic multinodular goiter: Secondary | ICD-10-CM | POA: Diagnosis not present

## 2023-02-12 ENCOUNTER — Ambulatory Visit (HOSPITAL_BASED_OUTPATIENT_CLINIC_OR_DEPARTMENT_OTHER): Payer: Medicare Other | Admitting: Physical Therapy

## 2023-02-15 DIAGNOSIS — M5441 Lumbago with sciatica, right side: Secondary | ICD-10-CM | POA: Diagnosis not present

## 2023-02-20 ENCOUNTER — Ambulatory Visit (HOSPITAL_BASED_OUTPATIENT_CLINIC_OR_DEPARTMENT_OTHER): Payer: Medicare Other | Admitting: Physical Therapy

## 2023-02-24 DIAGNOSIS — M545 Low back pain, unspecified: Secondary | ICD-10-CM | POA: Diagnosis not present

## 2023-02-25 ENCOUNTER — Ambulatory Visit (HOSPITAL_BASED_OUTPATIENT_CLINIC_OR_DEPARTMENT_OTHER): Payer: Medicare Other | Admitting: Physical Therapy

## 2023-03-03 DIAGNOSIS — M4726 Other spondylosis with radiculopathy, lumbar region: Secondary | ICD-10-CM | POA: Diagnosis not present

## 2023-03-04 ENCOUNTER — Ambulatory Visit (HOSPITAL_BASED_OUTPATIENT_CLINIC_OR_DEPARTMENT_OTHER): Payer: Medicare Other | Admitting: Physical Therapy

## 2023-03-11 ENCOUNTER — Ambulatory Visit (HOSPITAL_BASED_OUTPATIENT_CLINIC_OR_DEPARTMENT_OTHER): Payer: Medicare Other | Admitting: Physical Therapy

## 2023-03-17 DIAGNOSIS — M545 Low back pain, unspecified: Secondary | ICD-10-CM | POA: Diagnosis not present

## 2023-03-18 ENCOUNTER — Ambulatory Visit (HOSPITAL_BASED_OUTPATIENT_CLINIC_OR_DEPARTMENT_OTHER): Payer: Medicare Other | Admitting: Physical Therapy

## 2023-03-22 DIAGNOSIS — M545 Low back pain, unspecified: Secondary | ICD-10-CM | POA: Diagnosis not present

## 2023-03-24 DIAGNOSIS — M545 Low back pain, unspecified: Secondary | ICD-10-CM | POA: Diagnosis not present

## 2023-03-25 ENCOUNTER — Ambulatory Visit (HOSPITAL_BASED_OUTPATIENT_CLINIC_OR_DEPARTMENT_OTHER): Payer: Medicare Other | Admitting: Physical Therapy

## 2023-04-21 DIAGNOSIS — M5416 Radiculopathy, lumbar region: Secondary | ICD-10-CM | POA: Diagnosis not present

## 2023-05-05 DIAGNOSIS — Z1231 Encounter for screening mammogram for malignant neoplasm of breast: Secondary | ICD-10-CM | POA: Diagnosis not present

## 2023-05-07 DIAGNOSIS — M5416 Radiculopathy, lumbar region: Secondary | ICD-10-CM | POA: Diagnosis not present

## 2023-06-16 DIAGNOSIS — M5416 Radiculopathy, lumbar region: Secondary | ICD-10-CM | POA: Diagnosis not present

## 2023-07-07 DIAGNOSIS — I10 Essential (primary) hypertension: Secondary | ICD-10-CM | POA: Diagnosis not present

## 2023-07-07 DIAGNOSIS — E039 Hypothyroidism, unspecified: Secondary | ICD-10-CM | POA: Diagnosis not present

## 2023-07-07 DIAGNOSIS — E559 Vitamin D deficiency, unspecified: Secondary | ICD-10-CM | POA: Diagnosis not present

## 2023-07-07 DIAGNOSIS — E785 Hyperlipidemia, unspecified: Secondary | ICD-10-CM | POA: Diagnosis not present

## 2023-07-07 DIAGNOSIS — Z0000 Encounter for general adult medical examination without abnormal findings: Secondary | ICD-10-CM | POA: Diagnosis not present

## 2023-07-07 DIAGNOSIS — M8588 Other specified disorders of bone density and structure, other site: Secondary | ICD-10-CM | POA: Diagnosis not present

## 2023-07-07 DIAGNOSIS — I700 Atherosclerosis of aorta: Secondary | ICD-10-CM | POA: Diagnosis not present

## 2023-07-07 DIAGNOSIS — I2510 Atherosclerotic heart disease of native coronary artery without angina pectoris: Secondary | ICD-10-CM | POA: Diagnosis not present

## 2023-07-07 DIAGNOSIS — E042 Nontoxic multinodular goiter: Secondary | ICD-10-CM | POA: Diagnosis not present

## 2023-07-07 DIAGNOSIS — Z23 Encounter for immunization: Secondary | ICD-10-CM | POA: Diagnosis not present

## 2023-07-07 DIAGNOSIS — G894 Chronic pain syndrome: Secondary | ICD-10-CM | POA: Diagnosis not present

## 2023-07-21 DIAGNOSIS — D241 Benign neoplasm of right breast: Secondary | ICD-10-CM | POA: Diagnosis not present

## 2023-07-21 DIAGNOSIS — N6311 Unspecified lump in the right breast, upper outer quadrant: Secondary | ICD-10-CM | POA: Diagnosis not present

## 2023-07-21 DIAGNOSIS — N6315 Unspecified lump in the right breast, overlapping quadrants: Secondary | ICD-10-CM | POA: Diagnosis not present

## 2023-07-28 ENCOUNTER — Other Ambulatory Visit: Payer: Self-pay

## 2023-07-28 DIAGNOSIS — N6311 Unspecified lump in the right breast, upper outer quadrant: Secondary | ICD-10-CM | POA: Diagnosis not present

## 2023-07-28 DIAGNOSIS — N62 Hypertrophy of breast: Secondary | ICD-10-CM | POA: Diagnosis not present

## 2023-07-29 LAB — SURGICAL PATHOLOGY

## 2023-08-04 DIAGNOSIS — B078 Other viral warts: Secondary | ICD-10-CM | POA: Diagnosis not present

## 2023-08-04 DIAGNOSIS — D485 Neoplasm of uncertain behavior of skin: Secondary | ICD-10-CM | POA: Diagnosis not present

## 2023-08-12 DIAGNOSIS — M47816 Spondylosis without myelopathy or radiculopathy, lumbar region: Secondary | ICD-10-CM | POA: Diagnosis not present

## 2023-08-21 DIAGNOSIS — M47816 Spondylosis without myelopathy or radiculopathy, lumbar region: Secondary | ICD-10-CM | POA: Diagnosis not present

## 2023-09-08 DIAGNOSIS — M5416 Radiculopathy, lumbar region: Secondary | ICD-10-CM | POA: Diagnosis not present

## 2023-09-15 DIAGNOSIS — H401123 Primary open-angle glaucoma, left eye, severe stage: Secondary | ICD-10-CM | POA: Diagnosis not present

## 2023-09-15 DIAGNOSIS — H26493 Other secondary cataract, bilateral: Secondary | ICD-10-CM | POA: Diagnosis not present

## 2023-09-15 DIAGNOSIS — H524 Presbyopia: Secondary | ICD-10-CM | POA: Diagnosis not present

## 2023-09-15 DIAGNOSIS — H40011 Open angle with borderline findings, low risk, right eye: Secondary | ICD-10-CM | POA: Diagnosis not present

## 2023-09-15 DIAGNOSIS — H35361 Drusen (degenerative) of macula, right eye: Secondary | ICD-10-CM | POA: Diagnosis not present

## 2023-09-29 DIAGNOSIS — M5416 Radiculopathy, lumbar region: Secondary | ICD-10-CM | POA: Diagnosis not present

## 2023-10-06 DIAGNOSIS — G894 Chronic pain syndrome: Secondary | ICD-10-CM | POA: Diagnosis not present

## 2023-11-10 DIAGNOSIS — M5416 Radiculopathy, lumbar region: Secondary | ICD-10-CM | POA: Diagnosis not present

## 2023-12-02 ENCOUNTER — Encounter: Payer: Self-pay | Admitting: Emergency Medicine

## 2023-12-02 ENCOUNTER — Ambulatory Visit: Admission: EM | Admit: 2023-12-02 | Discharge: 2023-12-02 | Disposition: A | Discharge status: Home / Self Care

## 2023-12-02 DIAGNOSIS — M545 Low back pain, unspecified: Secondary | ICD-10-CM

## 2023-12-02 DIAGNOSIS — M542 Cervicalgia: Secondary | ICD-10-CM

## 2023-12-02 MED ORDER — CYCLOBENZAPRINE HCL 10 MG PO TABS
10.0000 mg | ORAL_TABLET | Freq: Every day | ORAL | 0 refills | Status: AC
Start: 2023-12-02 — End: ?

## 2023-12-02 MED ORDER — PREDNISONE 10 MG (21) PO TBPK: ORAL_TABLET | Freq: Every day | ORAL | 0 refills | Status: AC
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
## 2023-12-02 NOTE — Discharge Instructions (Signed)
 Your pain is most likely caused by irritation to the muscles .  Starting tomorrow take prednisone  every morning with food as directed to reduce inflammation and help with pain, avoid use of ibuprofen when she start steroid course, may continue Tylenol tramadol or any topical medicines  May use Flexeril  at bedtime to allow for wrist pain to prevent morning stiffness, be mindful this can make you feel drowsy, use cautiously with your tramadol  You may use heating pad in 15 minute intervals as needed for additional comfort  Begin massaging and stretching affected area daily for 10 minutes as tolerated to further loosen muscles   When sitting and lying down place pillow underneath and between knees for support  Can try sleeping without pillow on firm mattress   Practice good posture: head back, shoulders back, chest forward, pelvis back and weight distributed evenly on both legs  If pain persist after recommended treatment or reoccurs if may be beneficial to follow up with orthopedic specialist for evaluation, this doctor specializes in the bones and can manage your symptoms long-term with options such as but not limited to imaging, medications or physical therapy
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
## 2023-12-06 ENCOUNTER — Ambulatory Visit
Admission: EM | Admit: 2023-12-06 | Discharge: 2023-12-06 | Disposition: A | Discharge status: Home / Self Care | Attending: Physician Assistant | Admitting: Physician Assistant

## 2023-12-06 ENCOUNTER — Encounter: Payer: Self-pay | Admitting: Emergency Medicine

## 2023-12-06 DIAGNOSIS — R051 Acute cough: Secondary | ICD-10-CM | POA: Diagnosis not present

## 2023-12-06 DIAGNOSIS — U071 COVID-19: Secondary | ICD-10-CM | POA: Diagnosis not present

## 2023-12-06 LAB — POC SARS CORONAVIRUS 2 AG -  ED: SARS Coronavirus 2 Ag: POSITIVE — AB

## 2023-12-06 MED ORDER — BENZONATATE 100 MG PO CAPS
100.0000 mg | ORAL_CAPSULE | Freq: Three times a day (TID) | ORAL | 0 refills | Status: AC
Start: 2023-12-06 — End: ?

## 2023-12-06 MED ORDER — PAXLOVID (150/100) 10 X 150 MG & 10 X 100MG PO TBPK: 2.0000 | ORAL_TABLET | Freq: Two times a day (BID) | ORAL | 0 refills | Status: AC

## 2023-12-06 NOTE — Discharge Instructions (Signed)
 Take Paxlovid  as prescribed for COVID. Can take Tessalon  as needed for cough. Recommend Mucinex and Flonase for congestion. Can take Tylenol or ibuprofen as needed for fever and bodyaches. Recommend plenty of rest and fluids. Return for evaluation if you develop any trouble with your breathing.

## 2023-12-06 NOTE — ED Triage Notes (Signed)
 Pt presents c/o productive cough, sinus pressure, and a slightly sore throat x 2 days. Pt says the sxs are worsening as time passes. Pt denies emesis and diarrhea.

## 2023-12-06 NOTE — ED Provider Notes (Signed)
 [facility]    CSN: [id number] Arrival date & time: 12/06/23  1042      History   Chief Complaint Chief Complaint  Patient presents with   Facial Pain   Sore Throat   Cough    HPI Heidi Lucero is a 78 y.o. female.   Patient presents with productive cough, sinus pressure, headaches, sore throat that started 2 days ago.  She reports symptoms have gotten a little worse over the day.  She denies shortness of breath or wheezing.  No history of lung problems.  She denies sick contacts.  She has been taking over-the-counter medications with minimal relief.  Denies body aches, nausea, vomiting, abdominal pain.    Past Medical History:  Diagnosis Date   Anxiety    Essential hypertension 03/29/2013   Fibroids    GI (gastrointestinal bleed)    Hypercholesteremia    Hypertension    Hyperthyroidism    Lumbar disc disorder    , Bulging   Osteopenia    PSVT (paroxysmal supraventricular tachycardia) (HCC) 03/29/2013   Likely PAT.   Vitamin D deficiency     Patient Active Problem List   Diagnosis Date Noted   Syncope 03/29/2013   PSVT (paroxysmal supraventricular tachycardia) (HCC) 03/29/2013   Essential hypertension 03/29/2013    Past Surgical History:  Procedure Laterality Date   MYOMECTOMY      OB History   No obstetric history on file.      Home Medications    Prior to Admission medications   Medication Sig Start Date End Date Taking? Authorizing Provider  benzonatate  (TESSALON ) 100 MG capsule Take 1 capsule (100 mg total) by mouth every 8 (eight) hours. 12/06/23  Yes Ward, Harlene PEDLAR, PA-C  nirmatrelvir/ritonavir, renal dosing, (PAXLOVID , 150/100,) 10 x 150 MG & 10 x 100MG  TBPK Take 2 tablets by mouth 2 (two) times daily for 5 days. Dosage for moderate renal impairment (eGFR >/= 30 to <60 mL/min): 150 mg nirmatrelvir (one 150 mg tablet) with 100 mg ritonavir (one 100 mg tablet), with both tablets taken together twice daily for 5 days. Not recommended if  eGFR < 30 mL/min. PAXLOVID  is not recommend in patients with severe hepatic impairment (Child-Pugh Class C). 12/06/23 12/11/23 Yes Ward, Harlene PEDLAR, PA-C  amLODipine (NORVASC) 10 MG tablet Take 10 mg by mouth daily.    [provider]  atorvastatin (LIPITOR) 20 MG tablet Take 20 mg by mouth daily. 11/01/19   [provider]  azithromycin  (ZITHROMAX  Z-PAK) 250 MG tablet Take as directed. 07/29/14   Sonda Alm BROCKS, MD  cefdinir  (OMNICEF ) 300 MG capsule Take 1 capsule (300 mg total) by mouth 2 (two) times daily. 07/29/14   Sonda Alm BROCKS, MD  cephALEXin (KEFLEX) 500 MG capsule Take 500 mg by mouth 3 (three) times daily. 11/19/19   [provider]  cholecalciferol (VITAMIN D) 1000 UNITS tablet Take 1,000 Units by mouth daily.    [provider]  Coenzyme Q10 (CO Q 10) 100 MG CAPS Take 1 tablet by mouth daily.    [provider]  cyclobenzaprine  (FLEXERIL ) 10 MG tablet Take 1 tablet (10 mg total) by mouth at bedtime. 12/02/23   White, Shelba SAUNDERS, NP  diazepam  (VALIUM ) 2 MG tablet Take 1 tablet (2 mg total) by mouth at bedtime as needed for anxiety. 04/17/13   Piepenbrink, Delon, PA-C  ezetimibe (ZETIA) 10 MG tablet Take 10 mg by mouth daily.    [provider]  fluticasone (FLONASE) 50 MCG/ACT nasal  spray Place 2 sprays into both nostrils daily. 07/29/19   [provider]  gabapentin (NEURONTIN) 100 MG capsule Take 100 mg by mouth 3 (three) times daily as needed. 07/29/19   [provider]  ibuprofen (ADVIL) 800 MG tablet Take 800 mg by mouth 2 (two) times daily as needed. 11/12/23   [provider]  ipratropium (ATROVENT ) 0.06 % nasal spray Place 2 sprays into both nostrils 4 (four) times daily. 07/29/14   Sonda Alm BROCKS, MD  latanoprost (XALATAN) 0.005 % ophthalmic solution SMARTSIG:1 Drop(s) Left Eye Every Evening 11/11/23   [provider]  levothyroxine (SYNTHROID, LEVOTHROID) 50 MCG tablet Take 50 mcg by mouth daily.     [provider]  lisinopril-hydrochlorothiazide (PRINZIDE,ZESTORETIC) 20-25 MG per tablet Take 1 tablet by mouth daily.    [provider]  loratadine (CLARITIN) 10 MG tablet 1 tablet Orally Once a day; Duration: 30 day(s) 08/20/18   [provider]  Loratadine 10 MG CAPS Take 10 mg by mouth.    [provider]  metoprolol  succinate (TOPROL -XL) 25 MG 24 hr tablet Take 1 tablet (25 mg total) by mouth daily. 05/05/13   Jeffrie Oneil BROCKS, MD  Multiple Vitamin (MULTIVITAMIN WITH MINERALS) TABS Take 1 tablet by mouth daily.    [provider]  ondansetron  (ZOFRAN ) 4 MG tablet Take 1 tablet (4 mg total) by mouth every 8 (eight) hours as needed for nausea or vomiting. 10/25/21   Tegeler, Lonni PARAS, MD  Potassium Chloride ER 20 MEQ TBCR Take 1 tablet by mouth 2 (two) times daily. 11/01/19   [provider]  predniSONE  (STERAPRED UNI-PAK 21 TAB) 10 MG (21) TBPK tablet Take by mouth daily. Take 6 tabs by mouth daily  for 1 days, then 5 tabs for 1 days, then 4 tabs for 1 days, then 3 tabs for 1 days, 2 tabs for 1 days, then 1 tab by mouth daily for 1 days 12/02/23   Teresa Shelba SAUNDERS, NP  sertraline (ZOLOFT) 50 MG tablet 1 tablet Orally Once a day; Duration: 90 days 07/07/23   [provider]  traMADol (ULTRAM) 50 MG tablet Take by mouth. 12/01/19   [provider]  Vitamin D, Ergocalciferol, (DRISDOL) 50000 UNITS CAPS capsule Take 50,000 Units by mouth every 7 (seven) days.    [provider]    Family History Family History  Problem Relation Age of Onset   Hypertension Mother    Heart attack Father    Hypertension Father     Social History Social History   Tobacco Use   Smoking status: Never    Passive exposure: Never   Smokeless tobacco: Never  Vaping Use   Vaping status: Never Used  Substance Use Topics   Alcohol use: No   Drug use: No     Allergies   Colesevelam, Crestor [rosuvastatin], Ezetimibe, Lipitor  [atorvastatin], and Pravastatin   Review of Systems Review of Systems  Constitutional:  Negative for chills and fever.  HENT:  Positive for congestion and sore throat. Negative for ear pain.   Eyes:  Negative for pain and visual disturbance.  Respiratory:  Positive for cough. Negative for shortness of breath.   Cardiovascular:  Negative for chest pain and palpitations.  Gastrointestinal:  Negative for abdominal pain and vomiting.  Genitourinary:  Negative for dysuria and hematuria.  Musculoskeletal:  Negative for arthralgias and back pain.  Skin:  Negative for color change and rash.  Neurological:  Positive for headaches. Negative for seizures and  syncope.  All other systems reviewed and are negative.    Physical Exam Triage Vital Signs ED Triage Vitals  Encounter Vitals Group     BP 12/06/23 1124 (!) 160/78     Girls Systolic BP Percentile --      Girls Diastolic BP Percentile --      Boys Systolic BP Percentile --      Boys Diastolic BP Percentile --      Pulse Rate 12/06/23 1124 66     Resp 12/06/23 1124 18     Temp 12/06/23 1124 98 F (36.7 C)     Temp Source 12/06/23 1124 Oral     SpO2 12/06/23 1124 97 %     Weight 12/06/23 1124 147 lb 0.8 oz (66.7 kg)     Height --      Head Circumference --      Peak Flow --      Pain Score 12/06/23 1123 9     Pain Loc --      Pain Education --      Exclude from Growth Chart --    No data found.  Updated Vital Signs BP (!) 160/78 (BP Location: Left Arm)   Pulse 66   Temp 98 F (36.7 C) (Oral)   Resp 18   Wt 147 lb 0.8 oz (66.7 kg)   SpO2 97%   Visual Acuity Right Eye Distance:   Left Eye Distance:   Bilateral Distance:    Right Eye Near:   Left Eye Near:    Bilateral Near:     Physical Exam Vitals and nursing note reviewed.  Constitutional:      General: She is not in acute distress.    Appearance: She is well-developed.  HENT:     Head: Normocephalic and atraumatic.  Eyes:     Conjunctiva/sclera:  Conjunctivae normal.  Cardiovascular:     Rate and Rhythm: Normal rate and regular rhythm.     Heart sounds: No murmur heard. Pulmonary:     Effort: Pulmonary effort is normal. No respiratory distress.     Breath sounds: Normal breath sounds.  Abdominal:     Palpations: Abdomen is soft.     Tenderness: There is no abdominal tenderness.  Musculoskeletal:        General: No swelling.     Cervical back: Neck supple.  Skin:    General: Skin is warm and dry.     Capillary Refill: Capillary refill takes less than 2 seconds.  Neurological:     Mental Status: She is alert.  Psychiatric:        Mood and Affect: Mood normal.      UC Treatments / Results  Labs (all labs ordered are listed, but only abnormal results are displayed) Labs Reviewed  POC SARS CORONAVIRUS 2 AG -  ED - Abnormal; Notable for the following components:      Result Value   SARS Coronavirus 2 Ag Positive (*)    All other components within normal limits    EKG   Radiology No results found.  Procedures Procedures (including critical care time)  Medications Ordered in UC Medications - No data to display  Initial Impression / Assessment and Plan / UC Course  I have reviewed the triage vital signs and the nursing notes.  Pertinent labs & imaging results that were available during my care of the patient were reviewed by me and considered in my medical decision making (see chart for details).  COVID test positive in clinic today.  Given age will send in Paxlovid .  Renal dose prescribed as we do not have recent BMP.  Supportive care discussed.  Tessalon  prescribed for cough.  And overall well-appearing in no acute distress.  Vitals within normal limits, lungs clear to auscultation.  Return precautions discussed. Final Clinical Impressions(s) / UC Diagnoses   Final diagnoses:  COVID  Acute cough     Discharge Instructions      Take Paxlovid  as prescribed for COVID. Can take Tessalon  as needed for  cough. Recommend Mucinex and Flonase for congestion. Can take Tylenol or ibuprofen as needed for fever and bodyaches. Recommend plenty of rest and fluids. Return for evaluation if you develop any trouble with your breathing.   ED Prescriptions     Medication Sig Dispense Auth. Provider   benzonatate  (TESSALON ) 100 MG capsule Take 1 capsule (100 mg total) by mouth every 8 (eight) hours. 21 capsule Ward, Andrae Claunch Z, PA-C   nirmatrelvir/ritonavir, renal dosing, (PAXLOVID , 150/100,) 10 x 150 MG & 10 x 100MG  TBPK Take 2 tablets by mouth 2 (two) times daily for 5 days. Dosage for moderate renal impairment (eGFR >/= 30 to <60 mL/min): 150 mg nirmatrelvir (one 150 mg tablet) with 100 mg ritonavir (one 100 mg tablet), with both tablets taken together twice daily for 5 days. Not recommended if eGFR < 30 mL/min. PAXLOVID  is not recommend in patients with severe hepatic impairment (Child-Pugh Class C). 20 tablet Ward, Mikesha Migliaccio Z, PA-C      PDMP not reviewed this encounter.   Ward, Harlene PEDLAR, PA-C 12/06/23 1135

## 2023-12-09 DIAGNOSIS — M5416 Radiculopathy, lumbar region: Secondary | ICD-10-CM | POA: Diagnosis not present

## 2024-01-05 DIAGNOSIS — G894 Chronic pain syndrome: Secondary | ICD-10-CM | POA: Diagnosis not present

## 2024-01-05 DIAGNOSIS — I2510 Atherosclerotic heart disease of native coronary artery without angina pectoris: Secondary | ICD-10-CM | POA: Diagnosis not present

## 2024-01-05 DIAGNOSIS — I10 Essential (primary) hypertension: Secondary | ICD-10-CM | POA: Diagnosis not present

## 2024-01-05 DIAGNOSIS — E039 Hypothyroidism, unspecified: Secondary | ICD-10-CM | POA: Diagnosis not present

## 2024-01-05 DIAGNOSIS — E785 Hyperlipidemia, unspecified: Secondary | ICD-10-CM | POA: Diagnosis not present

## 2024-01-05 DIAGNOSIS — R110 Nausea: Secondary | ICD-10-CM | POA: Diagnosis not present

## 2024-01-05 DIAGNOSIS — I700 Atherosclerosis of aorta: Secondary | ICD-10-CM | POA: Diagnosis not present

## 2024-01-05 DIAGNOSIS — M5416 Radiculopathy, lumbar region: Secondary | ICD-10-CM | POA: Diagnosis not present

## 2024-01-12 DIAGNOSIS — M25561 Pain in right knee: Secondary | ICD-10-CM | POA: Diagnosis not present

## 2024-02-09 DIAGNOSIS — M1711 Unilateral primary osteoarthritis, right knee: Secondary | ICD-10-CM | POA: Diagnosis not present

## 2024-03-08 DIAGNOSIS — M1711 Unilateral primary osteoarthritis, right knee: Secondary | ICD-10-CM | POA: Diagnosis not present

## 2024-03-22 DIAGNOSIS — H40011 Open angle with borderline findings, low risk, right eye: Secondary | ICD-10-CM | POA: Diagnosis not present

## 2024-03-22 DIAGNOSIS — H401123 Primary open-angle glaucoma, left eye, severe stage: Secondary | ICD-10-CM | POA: Diagnosis not present

## 2024-06-04 ENCOUNTER — Encounter: Payer: Self-pay | Admitting: Family Medicine

## 2024-06-04 DIAGNOSIS — Z1231 Encounter for screening mammogram for malignant neoplasm of breast: Secondary | ICD-10-CM
